# Patient Record
Sex: Male | Born: 1945
Health system: Southern US, Community
[De-identification: ages and names within clinical notes are randomized; demographics above are authoritative.]

## PROBLEM LIST (undated history)

## (undated) DIAGNOSIS — Z87442 Personal history of urinary calculi: Secondary | ICD-10-CM

## (undated) DIAGNOSIS — Z9889 Other specified postprocedural states: Secondary | ICD-10-CM

## (undated) DIAGNOSIS — M1712 Unilateral primary osteoarthritis, left knee: Secondary | ICD-10-CM

## (undated) DIAGNOSIS — H269 Unspecified cataract: Secondary | ICD-10-CM

## (undated) DIAGNOSIS — M1711 Unilateral primary osteoarthritis, right knee: Secondary | ICD-10-CM

## (undated) DIAGNOSIS — K219 Gastro-esophageal reflux disease without esophagitis: Secondary | ICD-10-CM

## (undated) DIAGNOSIS — K222 Esophageal obstruction: Secondary | ICD-10-CM

## (undated) DIAGNOSIS — Z96652 Presence of left artificial knee joint: Secondary | ICD-10-CM

## (undated) DIAGNOSIS — D126 Benign neoplasm of colon, unspecified: Secondary | ICD-10-CM

## (undated) HISTORY — PX: LITHOTRIPSY: SUR834

## (undated) HISTORY — PX: KNEE SURGERY: SHX244

## (undated) HISTORY — DX: Benign neoplasm of colon, unspecified: D12.6

## (undated) HISTORY — PX: BUNIONECTOMY: SHX129

## (undated) HISTORY — DX: Gastro-esophageal reflux disease without esophagitis: K21.9

## (undated) HISTORY — PX: FINGER SURGERY: SHX640

## (undated) HISTORY — DX: Other specified postprocedural states: Z98.890

## (undated) HISTORY — DX: Esophageal obstruction: K22.2

## (undated) HISTORY — PX: UMBILICAL HERNIA REPAIR: SHX196

## (undated) HISTORY — PX: INGUINAL HERNIA REPAIR: SUR1180

---

## 1997-11-06 ENCOUNTER — Ambulatory Visit (HOSPITAL_BASED_OUTPATIENT_CLINIC_OR_DEPARTMENT_OTHER): Admission: RE | Admit: 1997-11-06 | Discharge: 1997-11-06 | Payer: Self-pay | Admitting: Orthopedic Surgery

## 1999-09-02 ENCOUNTER — Ambulatory Visit (HOSPITAL_BASED_OUTPATIENT_CLINIC_OR_DEPARTMENT_OTHER): Admission: RE | Admit: 1999-09-02 | Discharge: 1999-09-02 | Payer: Self-pay | Admitting: Orthopedic Surgery

## 2000-05-26 DIAGNOSIS — D126 Benign neoplasm of colon, unspecified: Secondary | ICD-10-CM

## 2000-05-26 HISTORY — DX: Benign neoplasm of colon, unspecified: D12.6

## 2000-11-24 ENCOUNTER — Encounter: Payer: Self-pay | Admitting: Family Medicine

## 2000-11-24 ENCOUNTER — Ambulatory Visit (HOSPITAL_COMMUNITY): Admission: RE | Admit: 2000-11-24 | Discharge: 2000-11-24 | Payer: Self-pay | Admitting: Family Medicine

## 2001-03-23 ENCOUNTER — Ambulatory Visit (HOSPITAL_COMMUNITY): Admission: RE | Admit: 2001-03-23 | Discharge: 2001-03-23 | Payer: Self-pay | Admitting: General Surgery

## 2001-07-28 ENCOUNTER — Ambulatory Visit (HOSPITAL_COMMUNITY): Admission: RE | Admit: 2001-07-28 | Discharge: 2001-07-28 | Payer: Self-pay | Admitting: Family Medicine

## 2001-07-28 ENCOUNTER — Encounter: Payer: Self-pay | Admitting: Family Medicine

## 2002-03-10 ENCOUNTER — Encounter: Payer: Self-pay | Admitting: Family Medicine

## 2002-03-10 ENCOUNTER — Ambulatory Visit (HOSPITAL_COMMUNITY): Admission: RE | Admit: 2002-03-10 | Discharge: 2002-03-10 | Payer: Self-pay | Admitting: Family Medicine

## 2004-02-26 ENCOUNTER — Ambulatory Visit (HOSPITAL_COMMUNITY): Admission: RE | Admit: 2004-02-26 | Discharge: 2004-02-26 | Payer: Self-pay | Admitting: Internal Medicine

## 2004-12-03 IMAGING — CT CT PELVIS W/O CM
1 of 2 series · 16 of 32 positions shown, 20 images · non-contrast
Comparison: 07/28/2001

HISTORY: Right flank pain, history kidney stones

CT ABDOMEN AND PELVIS WITHOUT CONTRAST
Multidetector helical CT imaging of abdomen and pelvis performed using kidney stone protocol.
Neither oral nor intravenous contrast utilized for this indication.

[Series 7266: — · axial · 0.62mm/px · z∈[+1331,+1711]mm · 16 of 84 slices shown, 20 images]
[im 4/84  soft-tissue]
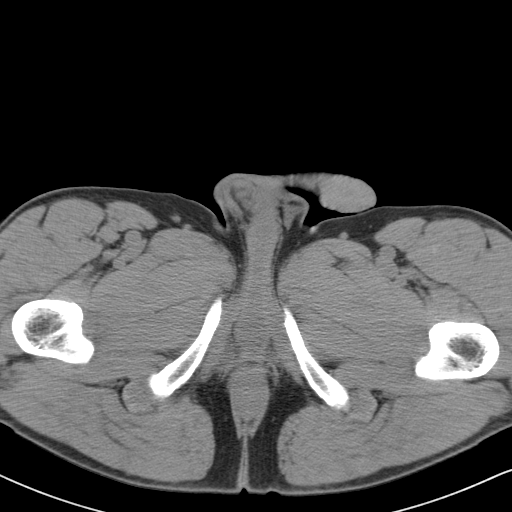
[im 4/84  bone]
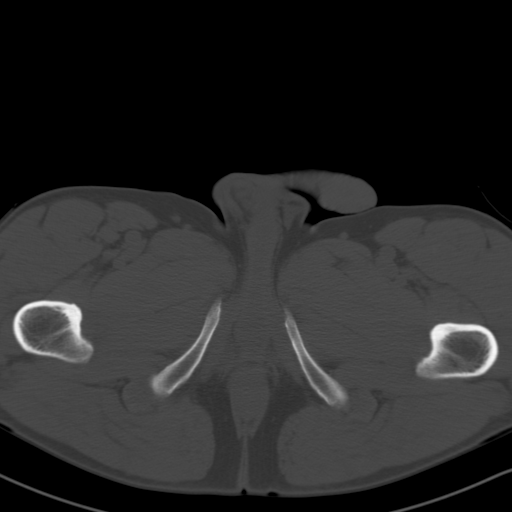
[im 11/84  soft-tissue]
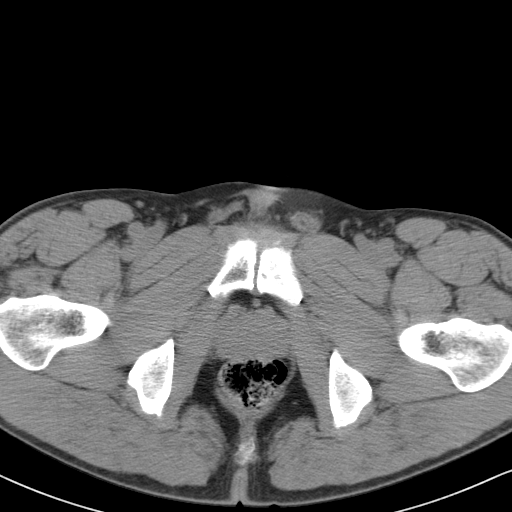
[im 18/84  soft-tissue]
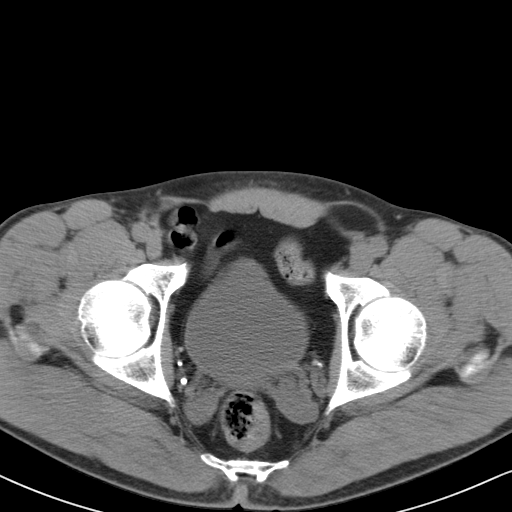
[im 21/84  soft-tissue]
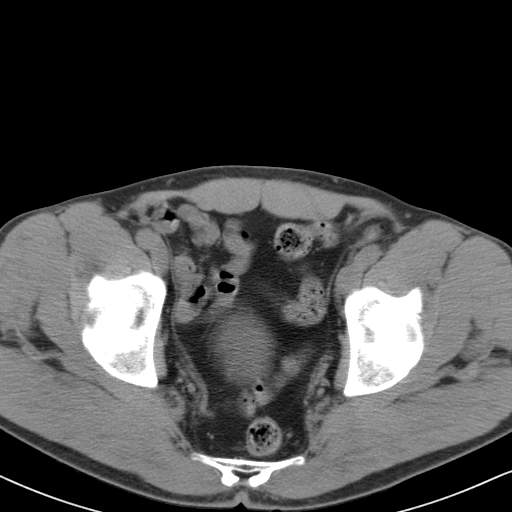
[im 28/84  soft-tissue]
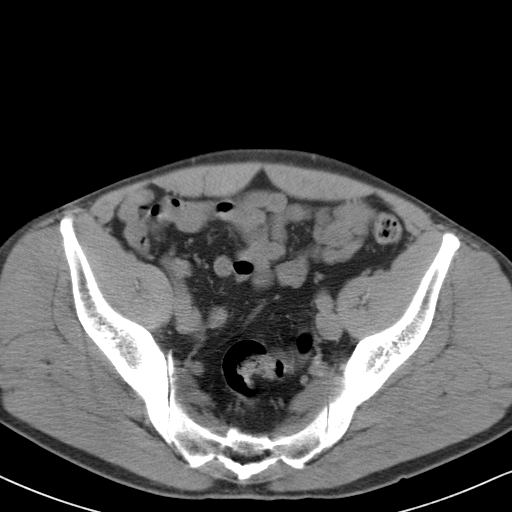
[im 35/84  soft-tissue]
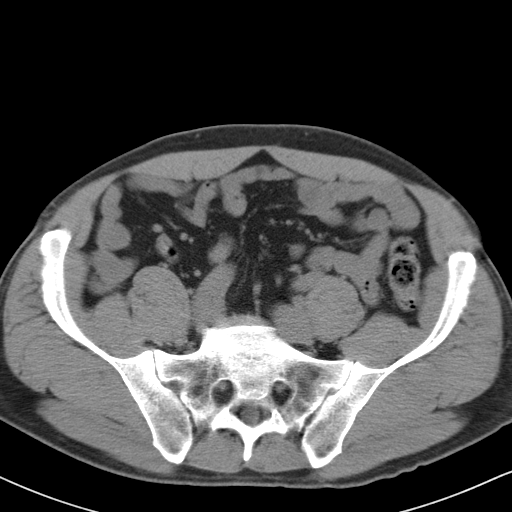
[im 39/84  soft-tissue]
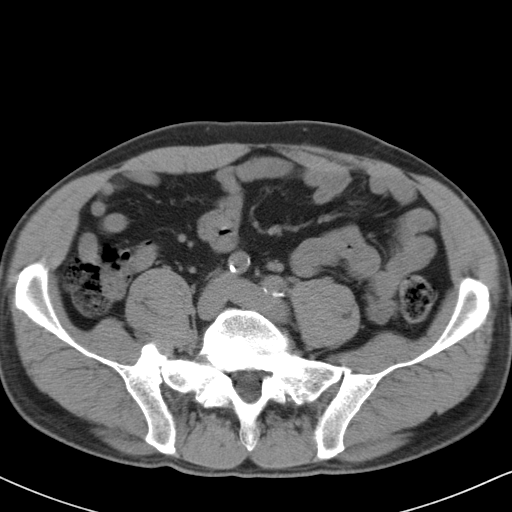
[im 45/84  soft-tissue]
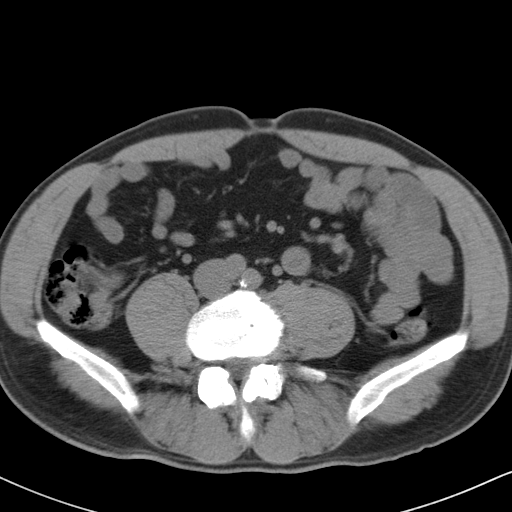
[im 49/84  soft-tissue]
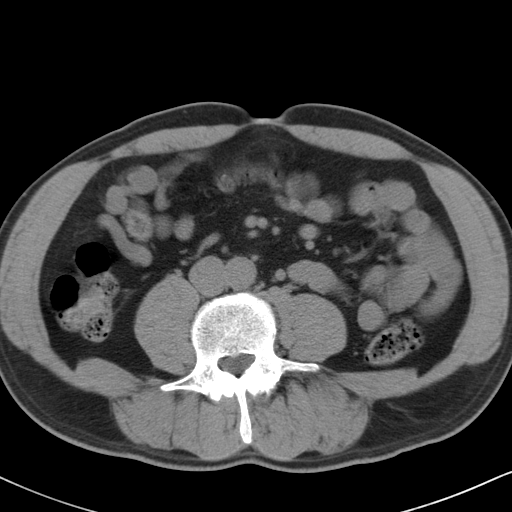
[im 49/84  bone]
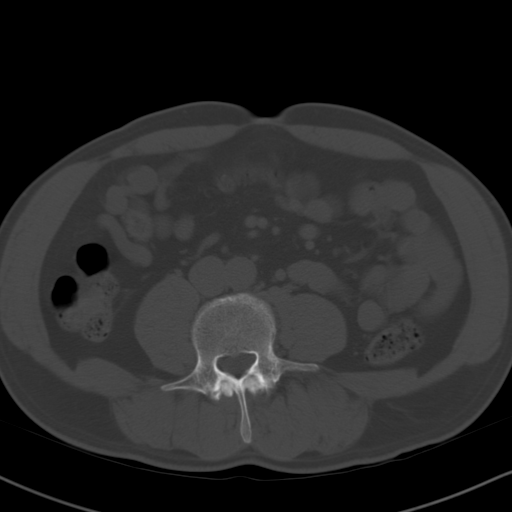
[im 56/84  soft-tissue]
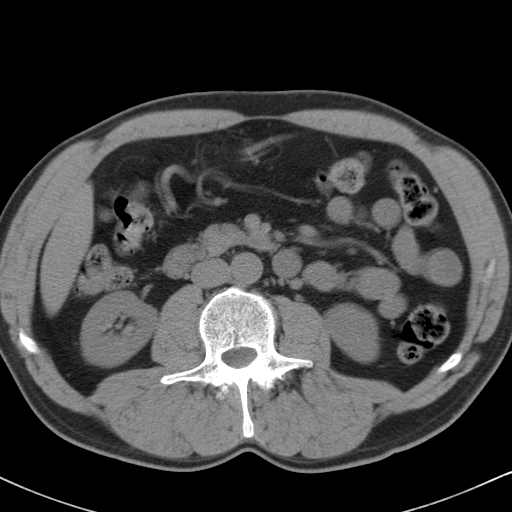
[im 63/84  soft-tissue]
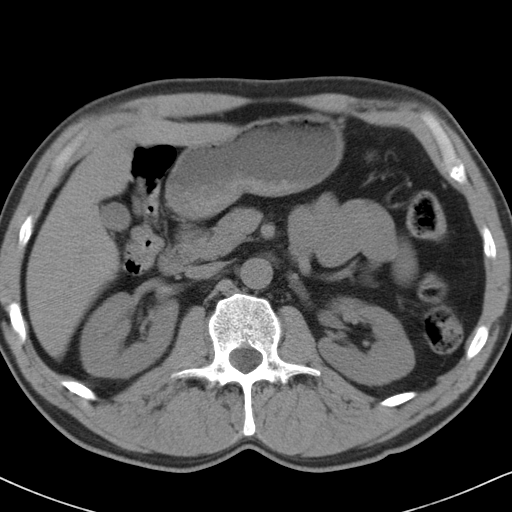
[im 66/84  soft-tissue]
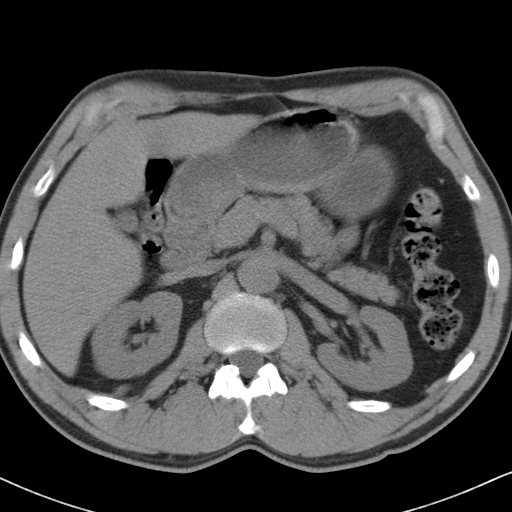
[im 70/84  lung]
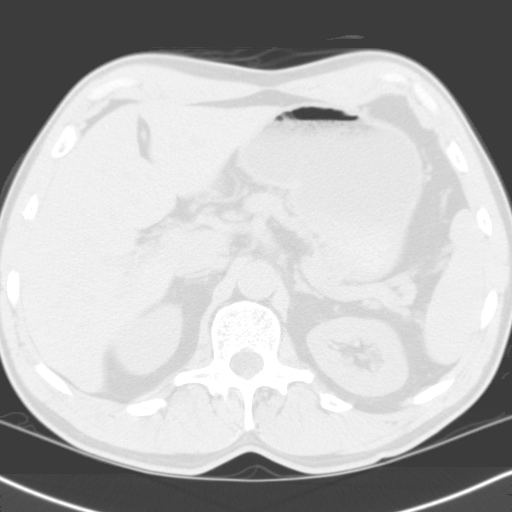
[im 73/84  soft-tissue]
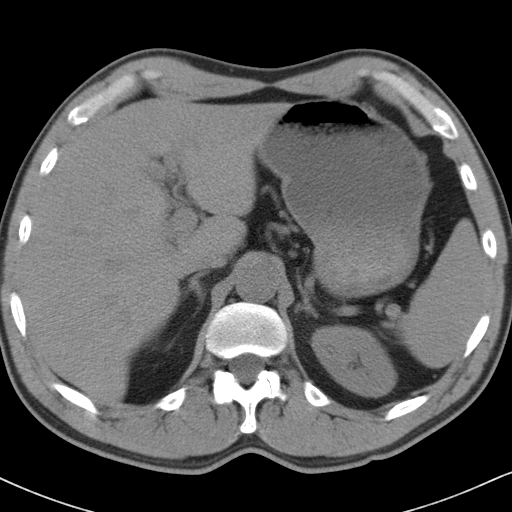
[im 73/84  lung]
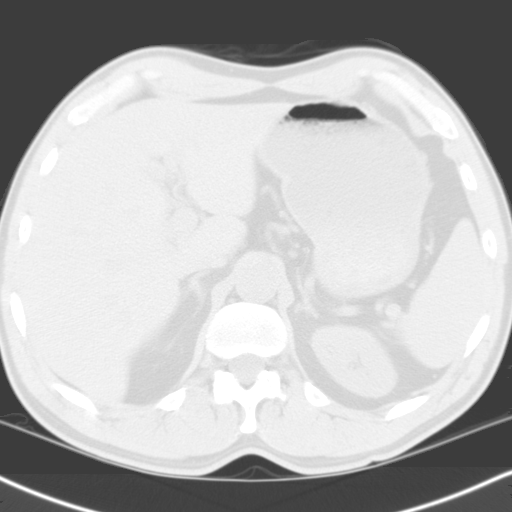
[im 77/84  lung]
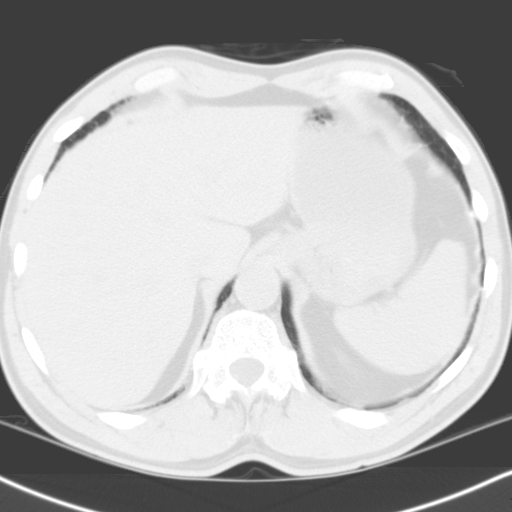
[im 80/84  soft-tissue]
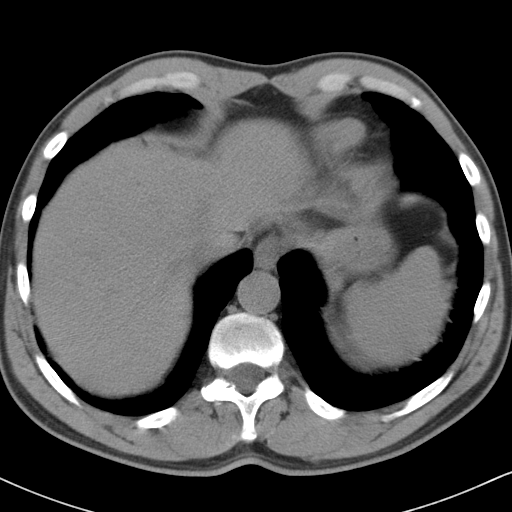
[im 80/84  lung]
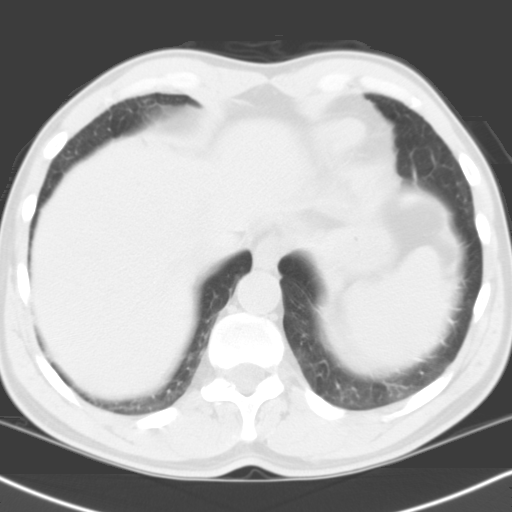

[16 of 32 positions shown; findings below may reference images not displayed]

CT ABDOMEN:

5 mm diameter calculus mid right kidney.
No hydronephrosis or ureteral dilatation.
Remaining solid organs and bowel loops unremarkable for a noncontrast study.
IMPRESSION: 5 mm diameter right renal calculus.
No evidence of ureteral calcification or obstruction.

CT PELVIS:

Normal appendix.
No distal ureteral calcification or dilatation.
Bilateral pelvic phleboliths.
No pelvic mass, adenopathy, or free fluid.
Pelvic bowel loops unremarkable.
No hernia.
IMPRESSION: No pelvic abnormalities.

## 2006-03-05 ENCOUNTER — Ambulatory Visit (HOSPITAL_COMMUNITY): Admission: RE | Admit: 2006-03-05 | Discharge: 2006-03-05 | Payer: Self-pay | Admitting: Family Medicine

## 2006-03-17 ENCOUNTER — Ambulatory Visit: Payer: Self-pay | Admitting: Internal Medicine

## 2006-05-15 ENCOUNTER — Ambulatory Visit: Payer: Self-pay | Admitting: Internal Medicine

## 2006-05-15 ENCOUNTER — Ambulatory Visit (HOSPITAL_COMMUNITY): Admission: RE | Admit: 2006-05-15 | Discharge: 2006-05-15 | Payer: Self-pay | Admitting: Internal Medicine

## 2006-05-15 DIAGNOSIS — K222 Esophageal obstruction: Secondary | ICD-10-CM

## 2006-05-15 DIAGNOSIS — Z9889 Other specified postprocedural states: Secondary | ICD-10-CM

## 2006-05-15 HISTORY — DX: Esophageal obstruction: K22.2

## 2006-05-15 HISTORY — DX: Other specified postprocedural states: Z98.890

## 2010-05-26 HISTORY — PX: ESOPHAGOGASTRODUODENOSCOPY: SHX1529

## 2010-10-11 NOTE — Op Note (Signed)
[place]  Patient:    Dustin Jacobson, Dustin Jacobson                MRN: [id number] Proc. Date: 09/02/99 Attending:  Elana Alm. Thurston Hole, M.D.                           Operative Report  PREOPERATIVE DIAGNOSIS:  Left knee medial meniscus tear.  POSTOPERATIVE DIAGNOSES: 1. Left knee medial and lateral meniscal tears. 2. 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 3. Left knee grade 1 anterior cruciate ligament laxity.  PROCEDURES: 1. Left knee examination under anesthesia, followed by arthroscopic partial medial    and lateral meniscectomies. 2. Left knee medial compartment and patellofemoral chondroplasty.  SURGEON:  Elana Alm. Thurston Hole, M.D.  ASSISTANT:  Kirstin Adelberger, P.A.  ANESTHESIA:  Local with MAC.  OPERATIVE TIME:  30 minutes.  COMPLICATIONS:  None.  INDICATIONS:  Mr. Vitali is a 65 year old gentleman, who has had 8-10 months of increasing left knee pain with signs and symptoms consistent with medial meniscus tear and mild ACL laxity.  Failed conservative care and is now to undergo arthroscopy.  DESCRIPTION OF PROCEDURE:  Mr. Mignano was brought to the operating room on September 02, 1999 after a block had been placed in the holding room.  Left knee was examined under anesthesia.  Range of motion zero to 125 degrees.  He had 1+ Lachman with an end point.  Knee stable to varus, valgus, and posterior stress with normal patella tracking.  Left leg was prepped using sterile Betadine and draped using sterile  technique.  Originally through an inferolateral portal, the arthroscope with a ump attachment was placed and through an inferomedial portal, an arthroscopic probe was placed.  On initial inspection of medial compartment, he was found to have 75% grade 3 chondromalacia medial femoral condyle, which was thoroughly debrided. Medial tibial plateau showed mild grade 1 and 2 changes.  Medial meniscus showed  a complex tear posterior and medial horn of which 50% was resected back to a stable rim.  ACL did appear to be intact.  There was some mild laxity 3-5 mm, but there was a firm end point.  Posterior cruciate was intact and stable.  Lateral compartment showed only mild grade 1 changes.  Lateral meniscus showed a partial tear 25% of the inner surface at the posterolateral corner, which was debrided nd partially resected back to a stable rim.  Patellofemoral joint showed grade 3 chondromalacia over 75% of the femoral groove and the patella, which was thoroughly debrided.  Patella tracked normally.  Moderately thickened synovitis was debrided. The medial and lateral gutters otherwise they were free of pathology.  At this point, it is felt that all pathology had been satisfactorily addressed.  The instruments were removed.  Portals were closed with 3-0 nylon suture and injected with 0.25% Marcaine with epinephrine and 5 mg of morphine.  Sterile dressings applied and the patient awakened and taken to recovery room in stable condition.  FOLLOW-UP CARE:  Mr. Moan will be followed as an outpatient on Vicodin and Naprosyn.  See me back in my office in a week for sutures out and follow-up. DD:  09/02/99 TD:  09/02/99 Job: [id number]

## 2010-10-11 NOTE — Consult Note (Signed)
NAME:  TAINO, MAERTENS NO.:  [id number]   MEDICAL RECORD NO.:  [id number]           PATIENT TYPE:   LOCATION:                                 FACILITY:   PHYSICIAN:  R. Roetta Sessions, M.D. DATE OF BIRTH:  November 15, 1945   DATE OF CONSULTATION:  03/17/2006  DATE OF DISCHARGE:                                   CONSULTATION   REQUESTING PHYSICIAN:  Kirk Ruths, M.D.   CHIEF COMPLAINT:  Constipation, rectal bleeding, trouble swallowing.   HISTORY OF PRESENT ILLNESS:  Mr. Schroader is a 65 year old Caucasian  gentleman who presents for further evaluation of above stated symptoms.  He  has been having difficulty swallowing food 2-3 times a week.  He states he  is actually when he swallowing cold liquids along with solid foods he notes  that he gets hiccups and often regurgitates his food.  He denies any  associated pain, no heartburn, abdominal pain.  He has issues with  constipation but generally has several bowel movements a week and is trying  to control it with a high fiber diet.  He has a history of hemorrhoids and  feels that his rectal bleeding is related to that.  He reports having 3  polyps removed at his last colonoscopy 5 years ago and is due for a followup  colonoscopy.   CURRENT MEDICATIONS:  1. Nexium 40 mg daily, just started 2 weeks ago.  2. Diclofenac 75 mg b.i.d., taken for arthritis.   ALLERGIES:  NO KNOWN DRUG ALLERGIES.   PAST MEDICAL HISTORY:  Arthritis.   PAST SURGICAL HISTORY:  1. He had ACL repair of his right knee.  2. He had a left knee scope.  3. Left middle finger surgery.  4. Tonsillectomy.  5. Umbilical hernia repair.  6. Left inguinal hernia repair.   FAMILY HISTORY:  Mother died at age 48 of CHF.  Father died at age 22 of MI.  No family history of colorectal cancer.   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  Never been a smoker.  No alcohol  use.   REVIEW OF SYSTEMS:  See HPI for GI.  CONSTITUTIONAL:  No weight loss.  CARDIOPULMONARY:  No chest pain or shortness of breath.   PHYSICAL EXAMINATION:  VITAL SIGNS: Weight 184, height 5 feet 10 inches,  temp 98.2, blood pressure 122/60, pulse 74.  GENERAL:  A pleasant, well nourished, well developed Caucasian male in no  acute distress.  SKIN:  Warm and dry.  No jaundice.  HEENT:  Sclerae are nonicteric.  Oropharyngeal mucosa is moist and pink.  No  lesions, erythema or exudate.  No lymphadenopathy, thyromegaly.  CHEST/LUNGS:  Clear to auscultation.  CARDIAC:  Reveals a regular rate and rhythm.  Normal S1, S2.  No murmurs,  rubs, or gallops.  ABDOMEN:  Positive bowel sounds.  Soft, nontender, nondistended.  No  organomegaly or masses.  No rebound tenderness or guarding.  No abdominal  bruits or hernias.  EXTREMITIES:  No  edema.   IMPRESSION:  1. Mr. Springer is a 65 year old gentleman who has a history of chronic      constipation, intermittent hematochezia in the setting of hemorrhoidal      disease.  2. He also reports a history of colonic polyps, last colonoscopy 5 years      ago.  3. He complains of dysphagia.  It is not clearly solid food dysphagia and      he has regurgitation when he develops hiccups with cold liquids.      Symptoms are frequent.  Would recommend upper endoscopy for further      evaluation at time of colonoscopy.   I discussed risks, alternatives, benefits with regards to risk of reaction  to medication, incomplete exam, risk of bleeding, perforation, infection.  The patient is agreeable to proceed.   PLAN:  1. EGD with possible esophageal dilatation, colonoscopy near future.  2. He will continue Nexium 40 mg daily for now.  3. Further recommendations to follow.      Tana Coast, P.AJonathon Bellows, M.D.  Electronically Signed    LL/MEDQ  D:  03/17/2006  T:  03/18/2006  Job:  [id number]   cc:   Kirk Ruths, M.D.  Fax:  [phone]

## 2010-10-11 NOTE — Op Note (Signed)
NAME:  Dustin Jacobson, Dustin Jacobson NO.:  [id number]   MEDICAL RECORD NO.:  [id number]          PATIENT TYPE:  AMB   LOCATION:  DAY                           FACILITY:  [facility]   PHYSICIAN:  R. Roetta Sessions, M.D. DATE OF BIRTH:  06-Dec-1945   DATE OF PROCEDURE:  05/15/2006  DATE OF DISCHARGE:                               OPERATIVE REPORT   INDICATIONS FOR PROCEDURE:  The patient is a 65 year old gentleman with  chronic esophageal dysphagia to solids and constipation and intermittent  hematochezia.  There is no family history colorectal neoplasia.  He  reports having a colonoscopy here by someone else five years ago, polyps  were found.  EGD and colonoscopy are now being done.  The for esophageal  dilation reviewed.  The potential risks, benefits and alternatives have  been discussed, he is agreeable.  Please see documentation in the  medical record.   PROCEDURE NOTE:  O2 saturation, blood pressure, and pulse rate was  monitored throughout the entire procedure.   CONSCIOUS SEDATION:  Versed 5 mg IV and Demerol 75 mg IV in divided  doses.   INSTRUMENTATION:  Pentax video chip system.   FINDINGS:  EGD:  Examination of the tubular esophagus revealed a  prominent Schatzki's ring.  The esophageal mucosa, otherwise, appeared  normal.  The EG junction was easily traversed.  Stomach:  The gastric cavity was empty and insufflated well with air.  A  thorough examination of the gastric mucosa with retroflexion in the  proximal stomach, esophagus, esophagogastric junction demonstrated only  a small hiatal hernia.  The pylorus was patent and easily traversed.  Examination of the bulb and second portion revealed no abnormalities.   THERAPEUTIC/DIAGNOSTIC MANEUVERS PERFORMED:  The scope was withdrawn.  A  54-French Maloney dilator was passed full insertion, a look back  revealed a tear at the level of the ring without apparent other  complication.  The patient tolerated the procedure  well and was prepared  for colonoscopy.   DESCRIPTION OF PROCEDURE:  Digital rectal exam revealed no  abnormalities.  The prep was excellent.   FINDINGS:  Colon:  The colonic mucosa was surveyed from rectosigmoid  junction through the left transverse and right colon to the appendiceal  orifice, ileocecal valve, and cecum.  These structures were well seen  and photographed for the record.  From this level, the scope was slowly  and cautiously withdrawn and all previously mentioned mucosal surfaces  were again seen.  The colonic mucosa appeared normal.  The scope was  pulled down into the rectum where a thorough examination of the rectal  mucosa, fossa, and retroflexion view of the anal verge was undertaken.  The patient had some friable anal canal hemorrhoids, otherwise, the  rectal mucosa appeared normal.  The patient tolerated both procedures  well and was reacted in endoscopy.   IMPRESSION:  EGD, prominent Schatzki's ring, otherwise normal esophagus  status post dilation as described above.  Small hiatal hernia, otherwise  normal stomach, D1, D2.  Colonoscopy findings, friable anal canal hemorrhoids, otherwise, normal  rectum and colon.   RECOMMENDATIONS:  1. Continue Nexium 40 mg orally daily.  2. Antireflux measures/literature provided to Mr. Mimbs.  3. Constipation and hemorrhoid literature provided to Mr. Balfour.  4. MiraLax 17 grams orally at bedtime p.r.n. constipation.  A ten day      course of Anusol HC suppositories, one per rectum at bedtime.  5.      I would recommend Mr. Chiappetta come back in five years for repeat      colonoscopy given his history of colonic polyps previously.      Jonathon Bellows, M.D.  Electronically Signed     RMR/MEDQ  D:  05/15/2006  T:  05/15/2006  Job:  [id number]   cc:   Kirk Ruths, M.D.  Fax: [phone]

## 2011-03-14 ENCOUNTER — Encounter: Payer: Self-pay | Admitting: Internal Medicine

## 2011-03-31 ENCOUNTER — Ambulatory Visit (INDEPENDENT_AMBULATORY_CARE_PROVIDER_SITE_OTHER): Payer: BC Managed Care – PPO | Admitting: Urgent Care

## 2011-03-31 ENCOUNTER — Encounter: Payer: Self-pay | Admitting: Urgent Care

## 2011-03-31 DIAGNOSIS — R1310 Dysphagia, unspecified: Secondary | ICD-10-CM

## 2011-03-31 DIAGNOSIS — D126 Benign neoplasm of colon, unspecified: Secondary | ICD-10-CM | POA: Insufficient documentation

## 2011-03-31 DIAGNOSIS — Q393 Congenital stenosis and stricture of esophagus: Secondary | ICD-10-CM

## 2011-03-31 DIAGNOSIS — K219 Gastro-esophageal reflux disease without esophagitis: Secondary | ICD-10-CM

## 2011-03-31 DIAGNOSIS — K222 Esophageal obstruction: Secondary | ICD-10-CM | POA: Insufficient documentation

## 2011-03-31 MED ORDER — OMEPRAZOLE 20 MG PO CPDR: 20.0000 mg | DELAYED_RELEASE_CAPSULE | Freq: Every day | ORAL | Status: DC

## 2011-03-31 NOTE — Assessment & Plan Note (Signed)
See dysphagia. ?

## 2011-03-31 NOTE — Progress Notes (Signed)
Primary Care Physician:  Cassell Smiles., MD Primary Gastroenterologist:  Dr. Jena Gauss  Chief Complaint  Patient presents with  . Colonoscopy   HPI:  Dustin Jacobson is a 65 y.o. male here to set up colonoscopy. He has history of polyps back in 2002 on colonoscopy done elsewhere. Last colonoscopy was by Dr. Jena Gauss in 2007 & it was normal. He denies any constipation or diarrhea. He denies any rectal bleeding or melena. He does however have chronic GERD.  He notes heartburn & indigestion at least once per week or so. He would like to try a PPI.  History of Schatzki's ring with last dilation 2007. Has been using Apple Cider for his reflux.  C/o solid food dysphagia.  Feels like food stuck in upper esophagus.    Denies any problems with liquids. Denies any odynophagia. His weight remains stable.  Past Medical History  Diagnosis Date  . Adenomatous colon polyp 2002  . Schatzki's ring 05/15/2006    Last EGD Dr Rourk-> dilated 55F, small hiatal hernia  . S/P colonoscopy 05/15/2006    Dr Audelia Hives anal canal hemorrhoids, otherwise normal  . GERD (gastroesophageal reflux disease)     Past Surgical History  Procedure Date  . Knee surgery     bilat  . Finger surgery   . Bunionectomy   . Inguinal hernia repair     bilat  . Umbilical hernia repair     Current Outpatient Prescriptions  Medication Sig Dispense Refill  . fish oil-omega-3 fatty acids 1000 MG capsule Take 2 g by mouth daily.        . Flaxseed, Linseed, (FLAX SEEDS PO) Take by mouth.        . Multiple Vitamin (MULTIVITAMIN) capsule Take 1 capsule by mouth daily.        . Red Yeast Rice 600 MG CAPS Take 600 mg by mouth daily.        Marland Kitchen omeprazole (PRILOSEC) 20 MG capsule Take 1 capsule (20 mg total) by mouth daily.  31 capsule  11    Allergies as of 03/31/2011  . (No Known Allergies)    Family History:There is no known family history of colorectal carcinoma , liver disease, or inflammatory bowel disease.  Problem Relation Age  of Onset  . Heart failure Mother   . Coronary artery disease Father   . Lung cancer Brother     History   Social History  . Marital Status: Divorced    Spouse Name: N/A    Number of Children: 2  . Years of Education: N/A   Occupational History  . retired; Runner, broadcasting/film/video    Social History Main Topics  . Smoking status: Never Smoker   . Smokeless tobacco: Not on file   Comment: quit about 40 yrs ago  . Alcohol Use: No  . Drug Use: No  . Sexually Active: Not on file   Other Topics Concern  . Not on file   Social History Narrative   Lives w/ wife    Review of Systems: Gen: Denies any fever, chills, sweats, anorexia, fatigue, weakness, malaise, weight loss, and sleep disorder CV: Denies chest pain, angina, palpitations, syncope, orthopnea, PND, peripheral edema, and claudication. Resp: Denies dyspnea at rest, dyspnea with exercise, cough, sputum, wheezing, coughing up blood, and pleurisy. GI: Denies vomiting blood, jaundice, and fecal incontinence.  GU : Denies urinary burning, blood in urine, urinary frequency, urinary hesitancy, nocturnal urination, and urinary incontinence. MS: Denies joint pain, limitation of movement, and swelling, stiffness, low  back pain, extremity pain. Denies muscle weakness, cramps, atrophy.  Derm: Denies rash, itching, dry skin, hives, moles, warts, or unhealing ulcers.  Psych: Denies depression, anxiety, memory loss, suicidal ideation, hallucinations, paranoia, and confusion. Heme: Denies bruising, bleeding, and enlarged lymph nodes.  Physical Exam: BP 121/73  Pulse 75  Temp(Src) 98.6 F (37 C) (Temporal)  Ht 5\' 10"  (1.778 m)  Wt 177 lb 6.4 oz (80.468 kg)  BMI 25.45 kg/m2 General:   Alert,  Well-developed, well-nourished, pleasant and cooperative in NAD Head:  Normocephalic and atraumatic. Eyes:  Sclera clear, no icterus.   Conjunctiva pink. Ears:  Normal auditory acuity. Nose:  No deformity, discharge,  or lesions. Mouth:  No deformity or  lesions, oropharynx pink and moist. Neck:  Supple; no masses or thyromegaly. Lungs:  Clear throughout to auscultation.   No wheezes, crackles, or rhonchi. No acute distress. Heart:  Regular rate and rhythm; no murmurs, clicks, rubs,  or gallops. Abdomen:  Soft, nontender and nondistended. No masses, hepatosplenomegaly or hernias noted. Normal bowel sounds, without guarding, and without rebound.   Rectal:  Deferred until time of colonoscopy.   Msk:  Symmetrical without gross deformities. Normal posture. Pulses:  Normal pulses noted. Extremities:  Without clubbing or edema. Neurologic:  Alert and  oriented x4;  grossly normal neurologically. Skin:  Intact without significant lesions or rashes. Cervical Nodes:  No significant cervical adenopathy. Psych:  Alert and cooperative. Normal mood and affect.  '

## 2011-03-31 NOTE — Assessment & Plan Note (Signed)
Chronic complicated GERD with history of Schatzki's ring. He has recurrent dysphasia and symptomatic GERD. He admits to being noncompliant with PPI.  Resume omeprazole 20 mg daily, #31, 11 refills. EGD/ED

## 2011-03-31 NOTE — Assessment & Plan Note (Signed)
Suspect recurrent Schatzki's ring. EGD with possible esophageal dilation with Dr. Jena Gauss in the near future.  I have discussed risks & benefits which include, but are not limited to, bleeding, infection, perforation & drug reaction.  The patient agrees with this plan & written consent will be obtained.

## 2011-03-31 NOTE — Progress Notes (Signed)
Cc to PCP 

## 2011-03-31 NOTE — Assessment & Plan Note (Signed)
History of colon polyps 2002. Last colonoscopy normal 2007. Due for surveillance colonoscopy.  I have discussed risks & benefits which include, but are not limited to, bleeding, infection, perforation & drug reaction.  The patient agrees with this plan & written consent will be obtained.

## 2011-03-31 NOTE — Patient Instructions (Signed)
Omepazole 20mg  daily Keep colonoscopy & EGD with possible dilation as planned.

## 2011-04-07 ENCOUNTER — Ambulatory Visit: Payer: Self-pay | Admitting: Urgent Care

## 2011-04-21 ENCOUNTER — Encounter (HOSPITAL_COMMUNITY): Payer: Self-pay | Admitting: Pharmacy Technician

## 2011-04-21 ENCOUNTER — Other Ambulatory Visit: Payer: Self-pay | Admitting: Gastroenterology

## 2011-04-21 DIAGNOSIS — Z860100 Personal history of colon polyps, unspecified: Secondary | ICD-10-CM

## 2011-04-21 DIAGNOSIS — K219 Gastro-esophageal reflux disease without esophagitis: Secondary | ICD-10-CM

## 2011-04-21 MED ORDER — PEG-KCL-NACL-NASULF-NA ASC-C 100 G PO SOLR: 1.0000 | Freq: Once | ORAL | Status: DC

## 2011-04-25 MED ORDER — SODIUM CHLORIDE 0.45 % IV SOLN
Freq: Once | INTRAVENOUS | Status: AC
  Administered 2011-04-28: 10:00:00 via INTRAVENOUS

## 2011-04-28 ENCOUNTER — Ambulatory Visit (HOSPITAL_COMMUNITY)
Admission: RE | Admit: 2011-04-28 | Discharge: 2011-04-28 | Disposition: A | Discharge status: Home / Self Care | Payer: BC Managed Care – PPO | Source: Ambulatory Visit | Attending: Internal Medicine | Admitting: Internal Medicine

## 2011-04-28 ENCOUNTER — Other Ambulatory Visit: Payer: Self-pay | Admitting: Internal Medicine

## 2011-04-28 ENCOUNTER — Encounter (HOSPITAL_COMMUNITY): Payer: Self-pay

## 2011-04-28 ENCOUNTER — Encounter (HOSPITAL_COMMUNITY)
Admission: RE | Disposition: A | Discharge status: Home / Self Care | Payer: Self-pay | Source: Ambulatory Visit | Attending: Internal Medicine

## 2011-04-28 DIAGNOSIS — Z860100 Personal history of colon polyps, unspecified: Secondary | ICD-10-CM | POA: Insufficient documentation

## 2011-04-28 DIAGNOSIS — K222 Esophageal obstruction: Secondary | ICD-10-CM

## 2011-04-28 DIAGNOSIS — R1310 Dysphagia, unspecified: Secondary | ICD-10-CM | POA: Insufficient documentation

## 2011-04-28 DIAGNOSIS — K2940 Chronic atrophic gastritis without bleeding: Secondary | ICD-10-CM | POA: Insufficient documentation

## 2011-04-28 DIAGNOSIS — K5730 Diverticulosis of large intestine without perforation or abscess without bleeding: Secondary | ICD-10-CM

## 2011-04-28 DIAGNOSIS — K449 Diaphragmatic hernia without obstruction or gangrene: Secondary | ICD-10-CM | POA: Insufficient documentation

## 2011-04-28 DIAGNOSIS — D126 Benign neoplasm of colon, unspecified: Secondary | ICD-10-CM | POA: Insufficient documentation

## 2011-04-28 DIAGNOSIS — K2100 Gastro-esophageal reflux disease with esophagitis, without bleeding: Secondary | ICD-10-CM | POA: Insufficient documentation

## 2011-04-28 DIAGNOSIS — Z1211 Encounter for screening for malignant neoplasm of colon: Secondary | ICD-10-CM

## 2011-04-28 DIAGNOSIS — A048 Other specified bacterial intestinal infections: Secondary | ICD-10-CM | POA: Insufficient documentation

## 2011-04-28 DIAGNOSIS — K219 Gastro-esophageal reflux disease without esophagitis: Secondary | ICD-10-CM

## 2011-04-28 HISTORY — PX: COLONOSCOPY: SHX5424

## 2011-04-28 SURGERY — COLONOSCOPY
Anesthesia: Moderate Sedation

## 2011-04-28 MED ORDER — BUTAMBEN-TETRACAINE-BENZOCAINE 2-2-14 % EX AERO
INHALATION_SPRAY | CUTANEOUS | Status: DC | PRN
  Administered 2011-04-28: 2 via TOPICAL

## 2011-04-28 MED ORDER — MEPERIDINE HCL 100 MG/ML IJ SOLN
INTRAMUSCULAR | Status: DC | PRN
  Administered 2011-04-28 (×2): 25 mg via INTRAVENOUS
  Administered 2011-04-28: 50 mg via INTRAVENOUS

## 2011-04-28 MED ORDER — STERILE WATER FOR IRRIGATION IR SOLN
Status: DC | PRN
  Administered 2011-04-28: 10:00:00

## 2011-04-28 MED ORDER — MEPERIDINE HCL 100 MG/ML IJ SOLN
INTRAMUSCULAR | Status: AC
  Filled 2011-04-28: qty 2

## 2011-04-28 MED ORDER — MIDAZOLAM HCL 5 MG/5ML IJ SOLN
INTRAMUSCULAR | Status: DC | PRN
  Administered 2011-04-28 (×2): 1 mg via INTRAVENOUS
  Administered 2011-04-28 (×2): 2 mg via INTRAVENOUS

## 2011-04-28 MED ORDER — MIDAZOLAM HCL 5 MG/5ML IJ SOLN
INTRAMUSCULAR | Status: AC
  Filled 2011-04-28: qty 10

## 2011-04-28 NOTE — H&P (Signed)
  I have seen & examined the patient prior to the procedure(s) today and reviewed the history and physical/consultation.  There have been no changes.  After consideration of the risks, benefits, alternatives and imponderables, the patient has consented to the procedure(s).   

## 2011-05-07 ENCOUNTER — Encounter (HOSPITAL_COMMUNITY): Payer: Self-pay | Admitting: Internal Medicine

## 2011-05-12 NOTE — Progress Notes (Signed)
Quick Note:  Pt aware, rx called [facility]. ______

## 2011-05-12 NOTE — Progress Notes (Signed)
Quick Note:  Tried to call pt- LMOM ______ 

## 2014-05-17 ENCOUNTER — Encounter: Payer: Self-pay | Admitting: Podiatry

## 2014-05-17 ENCOUNTER — Ambulatory Visit (INDEPENDENT_AMBULATORY_CARE_PROVIDER_SITE_OTHER): Payer: Medicare PPO

## 2014-05-17 ENCOUNTER — Ambulatory Visit (INDEPENDENT_AMBULATORY_CARE_PROVIDER_SITE_OTHER): Payer: Medicare PPO | Admitting: Podiatry

## 2014-05-17 VITALS — BP 135/86 | HR 70 | Resp 13 | Ht 70.0 in | Wt 175.0 lb

## 2014-05-17 DIAGNOSIS — M2041 Other hammer toe(s) (acquired), right foot: Secondary | ICD-10-CM

## 2014-05-17 DIAGNOSIS — Z472 Encounter for removal of internal fixation device: Secondary | ICD-10-CM

## 2014-05-17 MED ORDER — CEPHALEXIN 500 MG PO CAPS: 500.0000 mg | ORAL_CAPSULE | Freq: Three times a day (TID) | ORAL | Status: DC

## 2014-05-17 NOTE — Progress Notes (Signed)
   Subjective:    Patient ID: Dustin Jacobson, male    DOB: November 30, 1945, 68 y.o.   MRN: [id number]  HPI Comments: Pt states has multiple episodes of pain and infection with the right 2nd toenail after right bunionectomy and 2nd hammer toe repair surgery with Dr. Janus Molder 4 or 5 years.  Pt states this episode has been going on for 3 weeks.     Review of Systems  All other systems reviewed and are negative.      Objective:   Physical Exam        Assessment & Plan:

## 2014-05-17 NOTE — Progress Notes (Signed)
Subjective:     Patient ID: Dustin Jacobson, male   DOB: [birth year], 68 y.o.   MRN: [id number]  HPI patient states he's had a lot of problems with the distal medial aspect of the second toe right where he had previous hammertoe done with ingrown toenail deformity and irritated skin formation. Surgery was done approximate 6 years ago by another physician in this group   Review of Systems  All other systems reviewed and are negative.      Objective:   Physical Exam  Constitutional: He is oriented to person, place, and time.  Cardiovascular: Intact distal pulses.   Musculoskeletal: Normal range of motion.  Neurological: He is oriented to person, place, and time.  Skin: Skin is warm.  Nursing note and vitals reviewed.  neurovascular status found to be intact with muscle strength adequate and range of motion of the subtalar midtarsal joint within normal limits good healing structural site from bunion correction right hammertoe repair second right with incurvated nail bed medial border and distal keratotic lesion formation     Assessment:     Probable ingrown toenail deformity right with distal keratotic tissue formation    Plan:     H&P and condition discussed. At this point I infiltrated the right second toe 60 mg Xylocaine Marcaine mixture and remove the medial border of the nailbed when doing this I noted that there was a screw that was present secondary to a previous digital fusion done many years ago which may be causing him irritation. I recommended removal of the screw and started with a screwdriver which unfortunately was not successful due to a stripping of the screw head. I then worked with Dr. Jacqualyn Posey and we utilize numerous different devices were able to get it out by approximately 1 cm but no further due to coachman from the previous surgery and the years that was inside the bone. We cut it against the bone and it was satisfactory at this position flushed the area copiously with  sterile saline and applied sterile dressing. As a precautionary measure I did place on cephalexin 500 mg 3 times a day and sterile dressing will be maintained for the next week and he will be checked back. I do think this will solve his problem with removal of the fixation partial screw. Again encouraged if any issues should occur redness or any other issues associated with the procedure we performed today

## 2014-05-17 NOTE — Patient Instructions (Signed)

## 2014-05-24 ENCOUNTER — Encounter: Payer: Self-pay | Admitting: Podiatry

## 2014-05-24 ENCOUNTER — Ambulatory Visit (INDEPENDENT_AMBULATORY_CARE_PROVIDER_SITE_OTHER): Payer: Medicare PPO | Admitting: Podiatry

## 2014-05-24 ENCOUNTER — Ambulatory Visit (INDEPENDENT_AMBULATORY_CARE_PROVIDER_SITE_OTHER): Payer: Medicare PPO

## 2014-05-24 VITALS — BP 128/74 | HR 65 | Resp 16

## 2014-05-24 DIAGNOSIS — Z472 Encounter for removal of internal fixation device: Secondary | ICD-10-CM

## 2014-05-24 DIAGNOSIS — M79674 Pain in right toe(s): Secondary | ICD-10-CM

## 2014-05-24 NOTE — Progress Notes (Signed)
Subjective:     Patient ID: Dustin Jacobson, male   DOB: [birth year], 68 y.o.   MRN: [id number]  HPI patient states my toe is feeling quite a bit better and it is healing well and I am having slight yellowness on my left big toenail I wanted you to look at   Review of Systems     Objective:   Physical Exam Neurovascular status intact muscle strength adequate with crusted area right second toe removed a partial screw from the second digit. Slight yellowness of the hallux medial side    Assessment:     Damaged screw that was able to take the head out and is now feeling much better right second toe with area crusted and no proximal edema erythema drainage and mycotic nail infection left    Plan:     We applied Band-Aid and reviewed x-ray and discussed that things look good at this time but we will have to evaluate in the future. No treatment currently for left hallux as it is growing out

## 2014-07-05 ENCOUNTER — Encounter (HOSPITAL_COMMUNITY): Payer: Self-pay | Admitting: Emergency Medicine

## 2014-07-05 ENCOUNTER — Emergency Department (HOSPITAL_COMMUNITY)
Admission: EM | Admit: 2014-07-05 | Discharge: 2014-07-06 | Disposition: A | Discharge status: Home / Self Care | Payer: Medicare PPO | Attending: Emergency Medicine | Admitting: Emergency Medicine

## 2014-07-05 DIAGNOSIS — T7840XA Allergy, unspecified, initial encounter: Secondary | ICD-10-CM | POA: Diagnosis not present

## 2014-07-05 DIAGNOSIS — Z86010 Personal history of colonic polyps: Secondary | ICD-10-CM | POA: Insufficient documentation

## 2014-07-05 DIAGNOSIS — Z9889 Other specified postprocedural states: Secondary | ICD-10-CM | POA: Diagnosis not present

## 2014-07-05 DIAGNOSIS — Z79899 Other long term (current) drug therapy: Secondary | ICD-10-CM | POA: Insufficient documentation

## 2014-07-05 DIAGNOSIS — K219 Gastro-esophageal reflux disease without esophagitis: Secondary | ICD-10-CM | POA: Diagnosis not present

## 2014-07-05 DIAGNOSIS — Z87738 Personal history of other specified (corrected) congenital malformations of digestive system: Secondary | ICD-10-CM | POA: Insufficient documentation

## 2014-07-05 DIAGNOSIS — T39395A Adverse effect of other nonsteroidal anti-inflammatory drugs [NSAID], initial encounter: Secondary | ICD-10-CM | POA: Insufficient documentation

## 2014-07-05 DIAGNOSIS — Z792 Long term (current) use of antibiotics: Secondary | ICD-10-CM | POA: Diagnosis not present

## 2014-07-05 LAB — CBC WITH DIFFERENTIAL/PLATELET
Basophils Absolute: 0 10*3/uL (ref 0.0–0.1)
Basophils Relative: 0 % (ref 0–1)
EOS ABS: 0.2 10*3/uL (ref 0.0–0.7)
EOS PCT: 1 % (ref 0–5)
HEMATOCRIT: 36.7 % — AB (ref 39.0–52.0)
Hemoglobin: 12.2 g/dL — ABNORMAL LOW (ref 13.0–17.0)
LYMPHS ABS: 2.3 10*3/uL (ref 0.7–4.0)
Lymphocytes Relative: 19 % (ref 12–46)
MCH: 30.3 pg (ref 26.0–34.0)
MCHC: 33.2 g/dL (ref 30.0–36.0)
MCV: 91.1 fL (ref 78.0–100.0)
MONO ABS: 1.2 10*3/uL — AB (ref 0.1–1.0)
MONOS PCT: 10 % (ref 3–12)
Neutro Abs: 8.5 10*3/uL — ABNORMAL HIGH (ref 1.7–7.7)
Neutrophils Relative %: 70 % (ref 43–77)
Platelets: 167 10*3/uL (ref 150–400)
RBC: 4.03 MIL/uL — AB (ref 4.22–5.81)
RDW: 14.1 % (ref 11.5–15.5)
WBC: 12.3 10*3/uL — AB (ref 4.0–10.5)

## 2014-07-05 LAB — BASIC METABOLIC PANEL
ANION GAP: 3 — AB (ref 5–15)
BUN: 29 mg/dL — ABNORMAL HIGH (ref 6–23)
CALCIUM: 7.8 mg/dL — AB (ref 8.4–10.5)
CHLORIDE: 111 mmol/L (ref 96–112)
CO2: 25 mmol/L (ref 19–32)
CREATININE: 1.05 mg/dL (ref 0.50–1.35)
GFR calc non Af Amer: 71 mL/min — ABNORMAL LOW (ref >90)
GFR, EST AFRICAN AMERICAN: 83 mL/min — AB (ref >90)
Glucose, Bld: 147 mg/dL — ABNORMAL HIGH (ref 70–99)
Potassium: 3.7 mmol/L (ref 3.5–5.1)
SODIUM: 139 mmol/L (ref 135–145)

## 2014-07-05 MED ORDER — METHYLPREDNISOLONE SODIUM SUCC 125 MG IJ SOLR
125.0000 mg | Freq: Once | INTRAMUSCULAR | Status: AC
  Administered 2014-07-05: 125 mg via INTRAVENOUS
  Filled 2014-07-05: qty 2

## 2014-07-05 MED ORDER — DIPHENHYDRAMINE HCL 50 MG/ML IJ SOLN
25.0000 mg | Freq: Once | INTRAMUSCULAR | Status: AC
  Administered 2014-07-05: 25 mg via INTRAVENOUS
  Filled 2014-07-05: qty 1

## 2014-07-05 NOTE — ED Provider Notes (Signed)
CSN: [id number]     Arrival date & time 07/05/14  2317 History  This chart was scribed for Veryl Speak, MD by Molli Posey, ED Scribe. This patient was seen in room APA15/APA15 and the patient's care was started 11:22 PM.    Chief Complaint  Patient presents with  . Allergic Reaction   The history is provided by the patient. No language interpreter was used.   HPI Comments: Dustin Jacobson is a 69 y.o. male who presents to the Emergency Department complaining of an allergic reaction that occurred PTA. Pt says he was hammering nails today and it irritated the arthritis in his wrist so he took his wife's Relafen medication to help with his irritation. He says that the palms of his hands, feet and ears started itching and began to get red about 5 minutes afterwards. He states after 10 to 15 minutes he started to get dizzy and then vomited. He says that his wife told him he passed out. Pt states he took 2 benadryl PTA but he vomited shortly after. He says he is feeling better at this time but he is feeling cold and shivering. Pt reports no pertinent past medical history. He denies any throat swelling.   Past Medical History  Diagnosis Date  . Adenomatous colon polyp 2002  . Schatzki's ring 05/15/2006    Last EGD Dr Rourk-> dilated 30F, small hiatal hernia  . S/P colonoscopy 05/15/2006    Dr Laurine Blazer anal canal hemorrhoids, otherwise normal  . GERD (gastroesophageal reflux disease)    Past Surgical History  Procedure Laterality Date  . Knee surgery      bilat  . Finger surgery    . Bunionectomy    . Inguinal hernia repair      bilat  . Umbilical hernia repair    . Colonoscopy  04/28/2011    Procedure: COLONOSCOPY;  Surgeon: Daneil Dolin, MD;  Location: [facility];  Service: Endoscopy;  Laterality: N/A;  10:15   Family History  Problem Relation Age of Onset  . Heart failure Mother   . Coronary artery disease Father   . Lung cancer Brother   . Anesthesia problems Neg Hx    . Hypotension Neg Hx   . Malignant hyperthermia Neg Hx   . Pseudochol deficiency Neg Hx    History  Substance Use Topics  . Smoking status: Never Smoker   . Smokeless tobacco: Not on file     Comment: quit about 40 yrs ago  . Alcohol Use: No     Comment: occassionally    Review of Systems  Constitutional: Positive for chills.  HENT: Negative for trouble swallowing.   Gastrointestinal: Positive for nausea and vomiting.  Neurological: Positive for dizziness.  All other systems reviewed and are negative.   Allergies  Relafen  Home Medications   Prior to Admission medications   Medication Sig Start Date End Date Taking? Authorizing Provider  cephALEXin (KEFLEX) 500 MG capsule Take 1 capsule (500 mg total) by mouth 3 (three) times daily. 05/17/14   Wallene Huh, DPM  fish oil-omega-3 fatty acids 1000 MG capsule Take 1 g by mouth daily.     Historical Provider, MD  Flaxseed, Linseed, (FLAX SEEDS PO) Take by mouth.     Historical Provider, MD  ibuprofen (ADVIL,MOTRIN) 200 MG tablet Take 800 mg by mouth every 6 (six) hours as needed. For pain     Historical Provider, MD  MELOXICAM PO Take by mouth.    Historical  Provider, MD  Multiple Vitamin (MULTIVITAMIN) capsule Take 1 capsule by mouth daily.      Historical Provider, MD  omeprazole (PRILOSEC) 20 MG capsule Take 1 capsule (20 mg total) by mouth daily. 03/31/11 03/30/12  Andria Meuse, NP  omeprazole (PRILOSEC) 20 MG capsule Take 20 mg by mouth daily.    Historical Provider, MD  Red Yeast Rice 600 MG CAPS Take 600 mg by mouth daily.     Historical Provider, MD   BP 111/59 mmHg  Pulse 67  Resp 18  Ht 5\' 10"  (1.778 m)  Wt 175 lb (79.379 kg)  BMI 25.11 kg/m2  SpO2 100% Physical Exam  Constitutional: He is oriented to person, place, and time. He appears well-developed and well-nourished.  HENT:  Head: Normocephalic and atraumatic.  Eyes: Right eye exhibits no discharge. Left eye exhibits no discharge.  Neck: Neck supple.  No tracheal deviation present.  Cardiovascular: Normal rate.   Pulmonary/Chest: Effort normal. No respiratory distress.  Abdominal: He exhibits no distension.  Neurological: He is alert and oriented to person, place, and time.  Skin: Skin is warm and dry.  Psychiatric: He has a normal mood and affect. His behavior is normal.  Nursing note and vitals reviewed.   ED Course  Procedures   DIAGNOSTIC STUDIES: Oxygen Saturation is 100% on RA, normal by my interpretation.    COORDINATION OF CARE: 11:31 PM Discussed treatment plan with pt at bedside and pt agreed to plan.   Labs Review Labs Reviewed - No data to display  Imaging Review No results found.   EKG Interpretation None      MDM   Final diagnoses:  None    Patient presents with complaints of itching and lightheadedness after taking a dose of Relafen. He has never taken this medication before and it is one that belonged to his wife. He was given IV fluids by EMS and received steroids and Benadryl in the ER. Due to his lightheadedness and EKG and laboratory studies were obtained which were unremarkable. He was observed for approximately 2 hours, during which time his symptoms resolved and he is now feeling fine. He will be discharged to home with when necessary return.   I personally performed the services described in this documentation, which was scribed in my presence. The recorded information has been reviewed and is accurate.      Veryl Speak, MD 07/06/14 [phone]

## 2014-07-05 NOTE — ED Notes (Signed)
Patient reports took one of wife's prescription Relafen tablets due to experiencing wrist pain. States he began itching all over then developed hives. Patient reports also started having dizziness and vomiting. EMS reports blood pressure was 90/60 on arrival and patient had hives on chest.

## 2014-07-05 NOTE — ED Notes (Signed)
Patient received zofran 4 mg and 500 ml bolus PTA via EMS. Wife gave patient 50 mg of Benadryl PO.

## 2014-07-06 NOTE — Discharge Instructions (Signed)

## 2014-11-21 DIAGNOSIS — M1712 Unilateral primary osteoarthritis, left knee: Secondary | ICD-10-CM | POA: Diagnosis not present

## 2015-02-26 DIAGNOSIS — Z85828 Personal history of other malignant neoplasm of skin: Secondary | ICD-10-CM | POA: Diagnosis not present

## 2015-02-26 DIAGNOSIS — L570 Actinic keratosis: Secondary | ICD-10-CM | POA: Diagnosis not present

## 2015-02-26 DIAGNOSIS — D485 Neoplasm of uncertain behavior of skin: Secondary | ICD-10-CM | POA: Diagnosis not present

## 2015-03-06 DIAGNOSIS — M1712 Unilateral primary osteoarthritis, left knee: Secondary | ICD-10-CM | POA: Diagnosis not present

## 2015-05-22 DIAGNOSIS — Z6825 Body mass index (BMI) 25.0-25.9, adult: Secondary | ICD-10-CM | POA: Diagnosis not present

## 2015-05-22 DIAGNOSIS — E663 Overweight: Secondary | ICD-10-CM | POA: Diagnosis not present

## 2015-05-22 DIAGNOSIS — J0100 Acute maxillary sinusitis, unspecified: Secondary | ICD-10-CM | POA: Diagnosis not present

## 2015-05-22 DIAGNOSIS — J209 Acute bronchitis, unspecified: Secondary | ICD-10-CM | POA: Diagnosis not present

## 2015-05-22 DIAGNOSIS — Z1389 Encounter for screening for other disorder: Secondary | ICD-10-CM | POA: Diagnosis not present

## 2015-05-22 DIAGNOSIS — N400 Enlarged prostate without lower urinary tract symptoms: Secondary | ICD-10-CM | POA: Diagnosis not present

## 2015-06-11 ENCOUNTER — Ambulatory Visit (INDEPENDENT_AMBULATORY_CARE_PROVIDER_SITE_OTHER): Payer: Medicare Other

## 2015-06-11 ENCOUNTER — Encounter: Payer: Self-pay | Admitting: Podiatry

## 2015-06-11 ENCOUNTER — Ambulatory Visit (INDEPENDENT_AMBULATORY_CARE_PROVIDER_SITE_OTHER): Payer: Medicare Other | Admitting: Podiatry

## 2015-06-11 DIAGNOSIS — M779 Enthesopathy, unspecified: Secondary | ICD-10-CM | POA: Diagnosis not present

## 2015-06-11 DIAGNOSIS — L600 Ingrowing nail: Secondary | ICD-10-CM

## 2015-06-11 DIAGNOSIS — L84 Corns and callosities: Secondary | ICD-10-CM

## 2015-06-11 MED ORDER — TRIAMCINOLONE ACETONIDE 10 MG/ML IJ SUSP
10.0000 mg | Freq: Once | INTRAMUSCULAR | Status: AC
  Administered 2015-06-11: 10 mg

## 2015-06-11 NOTE — Progress Notes (Signed)
Subjective:     Patient ID: Dustin Jacobson, male   DOB: 27-May-1945, 70 y.o.   MRN: [id number]  HPI patient presents with painful second toe right foot with ingrown toenail on the medial border and inflammatory callus formation on the interphalangeal joint lateral side with pain and fluid buildup   Review of Systems     Objective:   Physical Exam Neurovascular status intact muscle strength was adequate with incurvated right second nail medial border that's painful and inflammatory changes with keratotic lesion at the interphalangeal joint right second toe    Assessment:     Ingrown toenail deformity right second toe and inflammatory capsular inflammation right interphalangeal joint second toe right    Plan:     H&P and x-rays reviewed with patient. Today I went ahead and I anesthetized the right second toe with 60 Milligan times like Marcaine mixture and remove the medial border right second toe and exposed matrix applying phenol 3 applications 30 seconds followed by alcohol lavage and sterile dressing. I then injected the interphalangeal joint right second toe lateral side 2 mg dexamethasone Kenalog to reduce inflammation debrided lesion and applied padding and reappoint as symptoms indicate

## 2015-06-11 NOTE — Patient Instructions (Signed)

## 2015-06-21 ENCOUNTER — Telehealth: Payer: Self-pay | Admitting: *Deleted

## 2015-06-21 NOTE — Telephone Encounter (Signed)
Left message for patient at [phone] (Home #) to check to see how they were doing from their ingrown toenail procedure that was performed on Monday, June 11, 2015. Waiting for a response.

## 2016-07-16 ENCOUNTER — Encounter (HOSPITAL_COMMUNITY)
Admission: RE | Admit: 2016-07-16 | Discharge: 2016-07-16 | Disposition: A | Discharge status: Home / Self Care | Payer: Medicare Other | Source: Ambulatory Visit | Attending: Orthopedic Surgery | Admitting: Orthopedic Surgery

## 2016-07-16 ENCOUNTER — Encounter (HOSPITAL_COMMUNITY): Payer: Self-pay

## 2016-07-16 ENCOUNTER — Encounter (HOSPITAL_COMMUNITY): Payer: Self-pay | Admitting: Physician Assistant

## 2016-07-16 DIAGNOSIS — M1712 Unilateral primary osteoarthritis, left knee: Secondary | ICD-10-CM | POA: Insufficient documentation

## 2016-07-16 DIAGNOSIS — Z01812 Encounter for preprocedural laboratory examination: Secondary | ICD-10-CM | POA: Diagnosis present

## 2016-07-16 HISTORY — DX: Personal history of urinary calculi: Z87.442

## 2016-07-16 HISTORY — DX: Unspecified cataract: H26.9

## 2016-07-16 LAB — CBC WITH DIFFERENTIAL/PLATELET
Basophils Absolute: 0 10*3/uL (ref 0.0–0.1)
Basophils Relative: 1 %
EOS ABS: 0.1 10*3/uL (ref 0.0–0.7)
EOS PCT: 2 %
HCT: 41 % (ref 39.0–52.0)
Hemoglobin: 13.5 g/dL (ref 13.0–17.0)
LYMPHS ABS: 2.1 10*3/uL (ref 0.7–4.0)
Lymphocytes Relative: 33 %
MCH: 29.5 pg (ref 26.0–34.0)
MCHC: 32.9 g/dL (ref 30.0–36.0)
MCV: 89.7 fL (ref 78.0–100.0)
MONOS PCT: 13 %
Monocytes Absolute: 0.9 10*3/uL (ref 0.1–1.0)
Neutro Abs: 3.4 10*3/uL (ref 1.7–7.7)
Neutrophils Relative %: 51 %
PLATELETS: 214 10*3/uL (ref 150–400)
RBC: 4.57 MIL/uL (ref 4.22–5.81)
RDW: 13.8 % (ref 11.5–15.5)
WBC: 6.5 10*3/uL (ref 4.0–10.5)

## 2016-07-16 LAB — COMPREHENSIVE METABOLIC PANEL
ALT: 12 U/L — ABNORMAL LOW (ref 17–63)
ANION GAP: 8 (ref 5–15)
AST: 20 U/L (ref 15–41)
Albumin: 3.7 g/dL (ref 3.5–5.0)
Alkaline Phosphatase: 74 U/L (ref 38–126)
BUN: 18 mg/dL (ref 6–20)
CALCIUM: 9 mg/dL (ref 8.9–10.3)
CHLORIDE: 106 mmol/L (ref 101–111)
CO2: 26 mmol/L (ref 22–32)
Creatinine, Ser: 1.18 mg/dL (ref 0.61–1.24)
GFR calc non Af Amer: >60 mL/min (ref >60)
Glucose, Bld: 106 mg/dL — ABNORMAL HIGH (ref 65–99)
POTASSIUM: 4 mmol/L (ref 3.5–5.1)
Sodium: 140 mmol/L (ref 135–145)
Total Bilirubin: 0.6 mg/dL (ref 0.3–1.2)
Total Protein: 6.6 g/dL (ref 6.5–8.1)

## 2016-07-16 LAB — TYPE AND SCREEN
ABO/RH(D): O POS
Antibody Screen: NEGATIVE

## 2016-07-16 LAB — ABO/RH: ABO/RH(D): O POS

## 2016-07-16 LAB — PROTIME-INR
INR: 1.04
Prothrombin Time: 13.6 seconds (ref 11.4–15.2)

## 2016-07-16 LAB — SURGICAL PCR SCREEN
MRSA, PCR: NEGATIVE
Staphylococcus aureus: NEGATIVE

## 2016-07-16 LAB — APTT: APTT: 30 s (ref 24–36)

## 2016-07-16 NOTE — Progress Notes (Signed)
Pt denies SOB, chest pain, and being under the care of a cardiologist. Pt denies having a cardiac cath and echo but stated that a stress test was performed > 10 years ago. Pt denies having an EKG and chest x ray within the last year. Pt denies having recent labs.

## 2016-07-16 NOTE — Pre-Procedure Instructions (Signed)
Zaylan Woosley Lejeune  07/16/2016      [place] [place] [id number] [place] [id number] Phone: [phone] Fax: [place], [place] [address] [place] [address] Phone: [phone] Fax: [phone]    Your procedure is scheduled on Monday, July 28, 2016  Report to [facility] Admitting at 5:30 A.M.  Call this number if you have problems the morning of surgery:  [phone]   Remember:  Do not eat food or drink liquids after midnight Sunday, July 27, 2016  Take these medicines the morning of surgery with A SIP OF WATER : None Stop taking Aspirin, vitamins, fish oil and herbal medications. Do not take any NSAIDs ie: Ibuprofen, Advil, Naproxen, BC and Goody Powder or any medication containing Aspirin such as meloxicam [facility]); stop Monday, July 21, 2016  Do not wear jewelry, make-up or nail polish.  Do not wear lotions, powders, or perfumes, or deoderant.  Do not shave 48 hours prior to surgery.  Men may shave face and neck.  Do not bring valuables to the hospital.  [facility] is not responsible for any belongings or valuables.  Contacts, dentures or bridgework may not be worn into surgery.  Leave your suitcase in the car.  After surgery it may be brought to your room.  For patients admitted to the hospital, discharge time will be determined by your treatment team.  Patients discharged the day of surgery will not be allowed to drive home.  Special instructions:   [place] - Preparing for Surgery  Before surgery, you can play an important role.  Because skin is not sterile, your skin needs to be as free of germs as possible.  You can reduce the number of germs on you skin by washing with CHG (chlorahexidine gluconate) soap before surgery.  CHG is an antiseptic cleaner which kills germs and bonds with the skin to continue killing germs even after  washing.  Please DO NOT use if you have an allergy to CHG or antibacterial soaps.  If your skin becomes reddened/irritated stop using the CHG and inform your nurse when you arrive at Short Stay.  Do not shave (including legs and underarms) for at least 48 hours prior to the first CHG shower.  You may shave your face.  Please follow these instructions carefully:   1.  Shower with CHG Soap the night before surgery and the morning of Surgery.  2.  If you choose to wash your hair, wash your hair first as usual with your normal shampoo.  3.  After you shampoo, rinse your hair and body thoroughly to remove the Shampoo.  4.  Use CHG as you would any other liquid soap.  You can apply chg directly  to the skin and wash gently with scrungie or a clean washcloth.  5.  Apply the CHG Soap to your body ONLY FROM THE NECK DOWN.  Do not use on open wounds or open sores.  Avoid contact with your eyes, ears, mouth and genitals (private parts).  Wash genitals (private parts) with your normal soap.  6.  Wash thoroughly, paying special attention to the area where your surgery will be performed.  7.  Thoroughly rinse your body with warm water from the neck down.  8.  DO NOT shower/wash with your normal soap after using and rinsing off the CHG Soap.  9.  Pat yourself dry with a clean towel.            10.  Wear clean pajamas.            11.  Place clean sheets on your bed the night of your first shower and do not sleep with pets.  Day of Surgery  Do not apply any lotions/deodorants the morning of surgery.  Please wear clean clothes to the hospital/surgery center.  Please read over the following fact sheets that you were given. Pain Booklet, Coughing and Deep Breathing, Blood Transfusion Information, Total Joint Packet, MRSA Information and Surgical Site Infection Prevention

## 2016-07-16 NOTE — H&P (Signed)
TOTAL KNEE ADMISSION H&P  Patient is being admitted for left total knee arthroplasty.  Subjective:  Chief Complaint:left knee pain.  HPI: Dustin Jacobson, 71 y.o. male, has a history of pain and functional disability in the left knee due to arthritis and has failed non-surgical conservative treatments for greater than 12 weeks to includeNSAID's and/or analgesics, corticosteriod injections, viscosupplementation injections, flexibility and strengthening excercises, supervised PT with diminished ADL's post treatment, use of assistive devices, weight reduction as appropriate and activity modification.  Onset of symptoms was gradual, starting 10 years ago with gradually worsening course since that time. The patient noted prior procedures on the knee to include  arthroscopy and menisectomy on the left knee(s).  Patient currently rates pain in the left knee(s) at 10 out of 10 with activity. Patient has night pain, worsening of pain with activity and weight bearing, pain that interferes with activities of daily living, crepitus and joint swelling.  Patient has evidence of subchondral sclerosis, periarticular osteophytes and joint space narrowing by imaging studies.  There is no active infection.  Patient Active Problem List   Diagnosis Date Noted  . Primary localized osteoarthritis of left knee   . GERD (gastroesophageal reflux disease) 03/31/2011  . Adenomatous colon polyp 03/31/2011  . Dysphagia 03/31/2011  . Schatzki's ring 03/31/2011   Past Medical History:  Diagnosis Date  . Adenomatous colon polyp 2002  . GERD (gastroesophageal reflux disease)   . Primary localized osteoarthritis of left knee   . S/P colonoscopy 05/15/2006   Dr Laurine Blazer anal canal hemorrhoids, otherwise normal  . Schatzki's ring 05/15/2006   Last EGD Dr Rourk-> dilated 37F, small hiatal hernia    Past Surgical History:  Procedure Laterality Date  . BUNIONECTOMY    . COLONOSCOPY  04/28/2011   Procedure: COLONOSCOPY;   Surgeon: Daneil Dolin, MD;  Location: [facility];  Service: Endoscopy;  Laterality: N/A;  10:15  . FINGER SURGERY    . INGUINAL HERNIA REPAIR     bilat  . KNEE SURGERY     bilat  . UMBILICAL HERNIA REPAIR      No current facility-administered medications for this encounter.   Current Outpatient Prescriptions:  .  meloxicam (MOBIC) 15 MG tablet, Take 15 mg by mouth daily as needed for pain., Disp: , Rfl:  .  cephALEXin (KEFLEX) 500 MG capsule, Take 1 capsule (500 mg total) by mouth 3 (three) times daily. (Patient not taking: Reported on 06/11/2015), Disp: 30 capsule, Rfl: 1   Allergies  Allergen Reactions  . Relafen [Nabumetone] Hives and Nausea And Vomiting    Dizziness     Social History  Substance Use Topics  . Smoking status: Never Smoker  . Smokeless tobacco: Not on file     Comment: quit about 40 yrs ago  . Alcohol use No     Comment: occassionally    Family History  Problem Relation Age of Onset  . Heart failure Mother   . Coronary artery disease Father   . Lung cancer Brother   . Anesthesia problems Neg Hx   . Hypotension Neg Hx   . Malignant hyperthermia Neg Hx   . Pseudochol deficiency Neg Hx      Review of Systems  Constitutional: Negative.   HENT: Negative.   Eyes: Negative.   Respiratory: Negative.   Cardiovascular: Negative.   Gastrointestinal: Negative.   Genitourinary: Negative.   Musculoskeletal: Positive for joint pain.  Skin: Negative.   Neurological: Negative.   Endo/Heme/Allergies: Negative.  Psychiatric/Behavioral: Negative.     Objective:  Physical Exam  Constitutional: He is oriented to person, place, and time. He appears well-developed and well-nourished.  HENT:  Head: Normocephalic and atraumatic.  Mouth/Throat: Oropharynx is clear and moist.  Eyes: Conjunctivae are normal. Pupils are equal, round, and reactive to light.  Neck: Neck supple.  Cardiovascular: Normal rate, regular rhythm and intact distal pulses.    Respiratory: Effort normal.  GI: Soft.  Genitourinary:  Genitourinary Comments: Not pertinent to current symptomatology therefore not examined.  Musculoskeletal:   Examination of his left knee reveals pain medially and laterally.  1+ synovitis.  1+ crepitation.  Range of motion -5 to 125 degrees.  Knee is stable with normal patella tracking.  Examination of his right knee reveals a moderate sized Bakers cyst posteriorly, but only mildly painful.  Full range of motion.  Knee is stable with normal patella tracking.  Vascular exam: Pulses are 2+ and symmetric.  Neurologic exam: Distal motor and sensory examination is within normal limits.    Neurological: He is alert and oriented to person, place, and time.  Skin: Skin is warm and dry.  Psychiatric: He has a normal mood and affect. His behavior is normal.    Vital signs in last 24 hours: Temp:  [97.8 F (36.6 C)] 97.8 F (36.6 C) (02/21 1400) Pulse Rate:  [69] 69 (02/21 1400) BP: (139)/(66) 139/66 (02/21 1400) SpO2:  [99 %] 99 % (02/21 1400) Weight:  [81.6 kg (180 lb)] 81.6 kg (180 lb) (02/21 1400)  Labs:   Estimated body mass index is 27.37 kg/m as calculated from the following:   Height as of this encounter: 5\' 8"  (1.727 m).   Weight as of this encounter: 81.6 kg (180 lb).   Imaging Review Plain radiographs demonstrate severe degenerative joint disease of the left knee(s). The overall alignment issignificant varus. The bone quality appears to be good for age and reported activity level.  Assessment/Plan:  End stage arthritis, left knee  Principal Problem:   Primary localized osteoarthritis of left knee Active Problems:   GERD (gastroesophageal reflux disease)   Adenomatous colon polyp   Dysphagia   Schatzki's ring   The patient history, physical examination, clinical judgment of the provider and imaging studies are consistent with end stage degenerative joint disease of the left knee(s) and total knee arthroplasty is  deemed medically necessary. The treatment options including medical management, injection therapy arthroscopy and arthroplasty were discussed at length. The risks and benefits of total knee arthroplasty were presented and reviewed. The risks due to aseptic loosening, infection, stiffness, patella tracking problems, thromboembolic complications and other imponderables were discussed. The patient acknowledged the explanation, agreed to proceed with the plan and consent was signed. Patient is being admitted for inpatient treatment for surgery, pain control, PT, OT, prophylactic antibiotics, VTE prophylaxis, progressive ambulation and ADL's and discharge planning. The patient is planning to be discharged home with home health services

## 2016-07-17 ENCOUNTER — Other Ambulatory Visit (HOSPITAL_COMMUNITY): Payer: Medicare PPO

## 2016-07-17 LAB — URINE CULTURE: Culture: NO GROWTH

## 2016-07-28 ENCOUNTER — Encounter (HOSPITAL_COMMUNITY): Payer: Self-pay | Admitting: Urology

## 2016-07-28 ENCOUNTER — Inpatient Hospital Stay (HOSPITAL_COMMUNITY): Payer: Medicare Other | Admitting: Certified Registered"

## 2016-07-28 ENCOUNTER — Encounter (HOSPITAL_COMMUNITY)
Admission: RE | Disposition: A | Discharge status: Home Health | Payer: Self-pay | Source: Ambulatory Visit | Attending: Orthopedic Surgery

## 2016-07-28 ENCOUNTER — Inpatient Hospital Stay (HOSPITAL_COMMUNITY)
Admission: RE | Admit: 2016-07-28 | Discharge: 2016-07-30 | DRG: 470 | Disposition: A | Discharge status: Home Health | Payer: Medicare Other | Source: Ambulatory Visit | Attending: Orthopedic Surgery | Admitting: Orthopedic Surgery

## 2016-07-28 DIAGNOSIS — K219 Gastro-esophageal reflux disease without esophagitis: Secondary | ICD-10-CM | POA: Diagnosis present

## 2016-07-28 DIAGNOSIS — K222 Esophageal obstruction: Secondary | ICD-10-CM | POA: Diagnosis present

## 2016-07-28 DIAGNOSIS — M1712 Unilateral primary osteoarthritis, left knee: Principal | ICD-10-CM | POA: Diagnosis present

## 2016-07-28 DIAGNOSIS — R1310 Dysphagia, unspecified: Secondary | ICD-10-CM

## 2016-07-28 DIAGNOSIS — D126 Benign neoplasm of colon, unspecified: Secondary | ICD-10-CM | POA: Diagnosis present

## 2016-07-28 DIAGNOSIS — M25562 Pain in left knee: Secondary | ICD-10-CM | POA: Diagnosis present

## 2016-07-28 HISTORY — DX: Unilateral primary osteoarthritis, left knee: M17.12

## 2016-07-28 HISTORY — PX: TOTAL KNEE ARTHROPLASTY: SHX125

## 2016-07-28 SURGERY — ARTHROPLASTY, KNEE, TOTAL
Anesthesia: Spinal | Site: Knee | Laterality: Left

## 2016-07-28 MED ORDER — OXYCODONE HCL 5 MG PO TABS
ORAL_TABLET | ORAL | Status: AC
  Filled 2016-07-28: qty 2

## 2016-07-28 MED ORDER — PHENOL 1.4 % MT LIQD: 1.0000 | OROMUCOSAL | Status: DC | PRN

## 2016-07-28 MED ORDER — CEFAZOLIN SODIUM-DEXTROSE 2-4 GM/100ML-% IV SOLN
2.0000 g | INTRAVENOUS | Status: AC
  Administered 2016-07-28: 2 g via INTRAVENOUS

## 2016-07-28 MED ORDER — LACTATED RINGERS IV SOLN: INTRAVENOUS | Status: DC

## 2016-07-28 MED ORDER — SODIUM CHLORIDE 0.9 % IR SOLN
Status: DC | PRN
  Administered 2016-07-28: 3000 mL

## 2016-07-28 MED ORDER — ASPIRIN EC 325 MG PO TBEC
325.0000 mg | DELAYED_RELEASE_TABLET | Freq: Every day | ORAL | Status: DC
  Administered 2016-07-29 – 2016-07-30 (×2): 325 mg via ORAL
  Filled 2016-07-28 (×2): qty 1

## 2016-07-28 MED ORDER — ALUM & MAG HYDROXIDE-SIMETH 200-200-20 MG/5ML PO SUSP
30.0000 mL | ORAL | Status: DC | PRN
  Administered 2016-07-29: 30 mL via ORAL
  Filled 2016-07-28: qty 30

## 2016-07-28 MED ORDER — MELOXICAM 7.5 MG PO TABS
15.0000 mg | ORAL_TABLET | Freq: Every day | ORAL | Status: DC
  Administered 2016-07-28 – 2016-07-30 (×3): 15 mg via ORAL
  Filled 2016-07-28 (×3): qty 2

## 2016-07-28 MED ORDER — BUPIVACAINE HCL (PF) 0.25 % IJ SOLN
INTRAMUSCULAR | Status: AC
  Filled 2016-07-28: qty 30

## 2016-07-28 MED ORDER — ROPIVACAINE HCL 7.5 MG/ML IJ SOLN
INTRAMUSCULAR | Status: DC | PRN
  Administered 2016-07-28: 20 mL via PERINEURAL

## 2016-07-28 MED ORDER — ONDANSETRON HCL 4 MG PO TABS: 4.0000 mg | ORAL_TABLET | Freq: Four times a day (QID) | ORAL | Status: DC | PRN

## 2016-07-28 MED ORDER — HYDROMORPHONE HCL 1 MG/ML IJ SOLN
INTRAMUSCULAR | Status: AC
  Filled 2016-07-28: qty 1

## 2016-07-28 MED ORDER — MEPERIDINE HCL 25 MG/ML IJ SOLN: 6.2500 mg | INTRAMUSCULAR | Status: DC | PRN

## 2016-07-28 MED ORDER — ACETAMINOPHEN 325 MG PO TABS: 650.0000 mg | ORAL_TABLET | Freq: Four times a day (QID) | ORAL | Status: DC | PRN

## 2016-07-28 MED ORDER — POVIDONE-IODINE 7.5 % EX SOLN: Freq: Once | CUTANEOUS | Status: DC

## 2016-07-28 MED ORDER — HYDROMORPHONE HCL 2 MG/ML IJ SOLN: 1.0000 mg | INTRAMUSCULAR | Status: DC | PRN

## 2016-07-28 MED ORDER — FENTANYL CITRATE (PF) 100 MCG/2ML IJ SOLN
INTRAMUSCULAR | Status: DC | PRN
  Administered 2016-07-28 (×2): 50 ug via INTRAVENOUS

## 2016-07-28 MED ORDER — LIDOCAINE 2% (20 MG/ML) 5 ML SYRINGE
INTRAMUSCULAR | Status: AC
  Filled 2016-07-28: qty 5

## 2016-07-28 MED ORDER — FENTANYL CITRATE (PF) 100 MCG/2ML IJ SOLN
INTRAMUSCULAR | Status: AC
  Filled 2016-07-28: qty 2

## 2016-07-28 MED ORDER — DEXAMETHASONE SODIUM PHOSPHATE 10 MG/ML IJ SOLN
10.0000 mg | Freq: Three times a day (TID) | INTRAMUSCULAR | Status: AC
  Administered 2016-07-28 – 2016-07-29 (×3): 10 mg via INTRAVENOUS
  Filled 2016-07-28 (×3): qty 1

## 2016-07-28 MED ORDER — LACTATED RINGERS IV SOLN
INTRAVENOUS | Status: DC | PRN
  Administered 2016-07-28 (×2): via INTRAVENOUS

## 2016-07-28 MED ORDER — CEFAZOLIN SODIUM-DEXTROSE 2-4 GM/100ML-% IV SOLN
INTRAVENOUS | Status: AC
  Filled 2016-07-28: qty 100

## 2016-07-28 MED ORDER — CHLORHEXIDINE GLUCONATE 4 % EX LIQD: 60.0000 mL | Freq: Once | CUTANEOUS | Status: DC

## 2016-07-28 MED ORDER — POLYETHYLENE GLYCOL 3350 17 G PO PACK
17.0000 g | PACK | Freq: Two times a day (BID) | ORAL | Status: DC
  Administered 2016-07-28 – 2016-07-30 (×5): 17 g via ORAL
  Filled 2016-07-28 (×5): qty 1

## 2016-07-28 MED ORDER — MIDAZOLAM HCL 5 MG/5ML IJ SOLN
INTRAMUSCULAR | Status: DC | PRN
  Administered 2016-07-28: 2 mg via INTRAVENOUS

## 2016-07-28 MED ORDER — DEXAMETHASONE SODIUM PHOSPHATE 10 MG/ML IJ SOLN
INTRAMUSCULAR | Status: DC | PRN
  Administered 2016-07-28: 10 mg via INTRAVENOUS

## 2016-07-28 MED ORDER — PROPOFOL 10 MG/ML IV BOLUS
INTRAVENOUS | Status: AC
  Filled 2016-07-28: qty 20

## 2016-07-28 MED ORDER — EPINEPHRINE PF 1 MG/ML IJ SOLN
INTRAMUSCULAR | Status: AC
  Filled 2016-07-28: qty 1

## 2016-07-28 MED ORDER — MENTHOL 3 MG MT LOZG: 1.0000 | LOZENGE | OROMUCOSAL | Status: DC | PRN

## 2016-07-28 MED ORDER — PROPOFOL 500 MG/50ML IV EMUL
INTRAVENOUS | Status: DC | PRN
  Administered 2016-07-28: 50 ug/kg/min via INTRAVENOUS

## 2016-07-28 MED ORDER — ACETAMINOPHEN 650 MG RE SUPP: 650.0000 mg | Freq: Four times a day (QID) | RECTAL | Status: DC | PRN

## 2016-07-28 MED ORDER — ONDANSETRON HCL 4 MG/2ML IJ SOLN
INTRAMUSCULAR | Status: DC | PRN
  Administered 2016-07-28: 4 mg via INTRAVENOUS

## 2016-07-28 MED ORDER — SODIUM CHLORIDE 0.9 % IV SOLN
1000.0000 mg | INTRAVENOUS | Status: AC
  Administered 2016-07-28: 1000 mg via INTRAVENOUS
  Filled 2016-07-28 (×2): qty 10

## 2016-07-28 MED ORDER — METOCLOPRAMIDE HCL 5 MG PO TABS: 5.0000 mg | ORAL_TABLET | Freq: Three times a day (TID) | ORAL | Status: DC | PRN

## 2016-07-28 MED ORDER — POTASSIUM CHLORIDE IN NACL 20-0.9 MEQ/L-% IV SOLN
INTRAVENOUS | Status: DC
Start: 2016-07-28 — End: 2016-07-29
  Administered 2016-07-28: 14:00:00 via INTRAVENOUS
  Filled 2016-07-28: qty 1000

## 2016-07-28 MED ORDER — FENTANYL CITRATE (PF) 100 MCG/2ML IJ SOLN
25.0000 ug | INTRAMUSCULAR | Status: DC | PRN
  Administered 2016-07-28 (×2): 50 ug via INTRAVENOUS

## 2016-07-28 MED ORDER — 0.9 % SODIUM CHLORIDE (POUR BTL) OPTIME
TOPICAL | Status: DC | PRN
  Administered 2016-07-28: 1000 mL

## 2016-07-28 MED ORDER — ONDANSETRON HCL 4 MG/2ML IJ SOLN
INTRAMUSCULAR | Status: AC
  Filled 2016-07-28: qty 2

## 2016-07-28 MED ORDER — ACETAMINOPHEN 500 MG PO TABS
1000.0000 mg | ORAL_TABLET | Freq: Four times a day (QID) | ORAL | Status: AC
  Administered 2016-07-28 – 2016-07-29 (×4): 1000 mg via ORAL
  Filled 2016-07-28 (×5): qty 2

## 2016-07-28 MED ORDER — DOCUSATE SODIUM 100 MG PO CAPS
100.0000 mg | ORAL_CAPSULE | Freq: Two times a day (BID) | ORAL | Status: DC
  Administered 2016-07-28 – 2016-07-30 (×5): 100 mg via ORAL
  Filled 2016-07-28 (×5): qty 1

## 2016-07-28 MED ORDER — DEXAMETHASONE SODIUM PHOSPHATE 10 MG/ML IJ SOLN
INTRAMUSCULAR | Status: AC
  Filled 2016-07-28: qty 1

## 2016-07-28 MED ORDER — HYDROMORPHONE HCL 1 MG/ML IJ SOLN
1.0000 mg | INTRAMUSCULAR | Status: DC | PRN
  Administered 2016-07-28: 1 mg via INTRAVENOUS

## 2016-07-28 MED ORDER — CEFAZOLIN SODIUM-DEXTROSE 2-4 GM/100ML-% IV SOLN
2.0000 g | Freq: Four times a day (QID) | INTRAVENOUS | Status: AC
  Administered 2016-07-28 (×2): 2 g via INTRAVENOUS
  Filled 2016-07-28 (×2): qty 100

## 2016-07-28 MED ORDER — DIPHENHYDRAMINE HCL 12.5 MG/5ML PO ELIX
12.5000 mg | ORAL_SOLUTION | ORAL | Status: DC | PRN
  Administered 2016-07-28: 25 mg via ORAL
  Filled 2016-07-28: qty 10

## 2016-07-28 MED ORDER — METOCLOPRAMIDE HCL 5 MG/ML IJ SOLN
5.0000 mg | Freq: Three times a day (TID) | INTRAMUSCULAR | Status: DC | PRN
  Administered 2016-07-29: 10 mg via INTRAVENOUS
  Filled 2016-07-28: qty 2

## 2016-07-28 MED ORDER — ONDANSETRON HCL 4 MG/2ML IJ SOLN
4.0000 mg | Freq: Four times a day (QID) | INTRAMUSCULAR | Status: DC | PRN
  Administered 2016-07-28: 4 mg via INTRAVENOUS
  Filled 2016-07-28: qty 2

## 2016-07-28 MED ORDER — BUPIVACAINE IN DEXTROSE 0.75-8.25 % IT SOLN
INTRATHECAL | Status: DC | PRN
  Administered 2016-07-28: 1.5 mL via INTRATHECAL

## 2016-07-28 MED ORDER — BUPIVACAINE-EPINEPHRINE 0.25% -1:200000 IJ SOLN
INTRAMUSCULAR | Status: DC | PRN
  Administered 2016-07-28: 30 mL

## 2016-07-28 MED ORDER — OXYCODONE HCL 5 MG PO TABS
5.0000 mg | ORAL_TABLET | ORAL | Status: DC | PRN
  Administered 2016-07-28 – 2016-07-30 (×11): 10 mg via ORAL
  Filled 2016-07-28 (×11): qty 2

## 2016-07-28 MED ORDER — MIDAZOLAM HCL 2 MG/2ML IJ SOLN
INTRAMUSCULAR | Status: AC
  Filled 2016-07-28: qty 2

## 2016-07-28 MED ORDER — METOCLOPRAMIDE HCL 5 MG/ML IJ SOLN: 10.0000 mg | Freq: Once | INTRAMUSCULAR | Status: DC | PRN

## 2016-07-28 SURGICAL SUPPLY — 78 items
APL SKNCLS STERI-STRIP NONHPOA (GAUZE/BANDAGES/DRESSINGS) ×1
BANDAGE ESMARK 6X9 LF (GAUZE/BANDAGES/DRESSINGS) ×1 IMPLANT
BENZOIN TINCTURE PRP APPL 2/3 (GAUZE/BANDAGES/DRESSINGS) ×3 IMPLANT
BLADE SAGITTAL 25.0X1.19X90 (BLADE) ×2 IMPLANT
BLADE SAGITTAL 25.0X1.19X90MM (BLADE) ×1
BLADE SAW SGTL 13X75X1.27 (BLADE) ×3 IMPLANT
BLADE SURG 10 STRL SS (BLADE) ×6 IMPLANT
BNDG CMPR 9X6 STRL LF SNTH (GAUZE/BANDAGES/DRESSINGS) ×1
BNDG CMPR MED 15X6 ELC VLCR LF (GAUZE/BANDAGES/DRESSINGS) ×1
BNDG ELASTIC 6X15 VLCR STRL LF (GAUZE/BANDAGES/DRESSINGS) ×3 IMPLANT
BNDG ESMARK 6X9 LF (GAUZE/BANDAGES/DRESSINGS) ×3
BOWL SMART MIX CTS (DISPOSABLE) ×3 IMPLANT
CAPT KNEE TOTAL 3 ATTUNE ×2 IMPLANT
CEMENT HV SMART SET (Cement) ×6 IMPLANT
CLOSURE WOUND 1/2 X4 (GAUZE/BANDAGES/DRESSINGS) ×1
COVER SURGICAL LIGHT HANDLE (MISCELLANEOUS) ×3 IMPLANT
CUFF TOURNIQUET SINGLE 34IN LL (TOURNIQUET CUFF) ×3 IMPLANT
CUFF TOURNIQUET SINGLE 44IN (TOURNIQUET CUFF) IMPLANT
DECANTER SPIKE VIAL GLASS SM (MISCELLANEOUS) ×3 IMPLANT
DRAPE EXTREMITY T 121X128X90 (DRAPE) ×3 IMPLANT
DRAPE HALF SHEET 40X57 (DRAPES) ×3 IMPLANT
DRAPE INCISE IOBAN 66X45 STRL (DRAPES) IMPLANT
DRAPE PROXIMA HALF (DRAPES) ×3 IMPLANT
DRAPE U-SHAPE 47X51 STRL (DRAPES) ×3 IMPLANT
DRSG AQUACEL AG ADV 3.5X14 (GAUZE/BANDAGES/DRESSINGS) ×3 IMPLANT
DURAPREP 26ML APPLICATOR (WOUND CARE) ×6 IMPLANT
ELECT CAUTERY BLADE 6.4 (BLADE) ×3 IMPLANT
ELECT REM PT RETURN 9FT ADLT (ELECTROSURGICAL) ×3
ELECTRODE REM PT RTRN 9FT ADLT (ELECTROSURGICAL) ×1 IMPLANT
FACESHIELD WRAPAROUND (MASK) ×6 IMPLANT
FACESHIELD WRAPAROUND OR TEAM (MASK) ×1 IMPLANT
FILTER STRAW FLUID ASPIR (MISCELLANEOUS) ×2 IMPLANT
GLOVE BIO SURGEON STRL SZ7 (GLOVE) ×3 IMPLANT
GLOVE BIOGEL PI IND STRL 7.0 (GLOVE) ×1 IMPLANT
GLOVE BIOGEL PI IND STRL 7.5 (GLOVE) ×1 IMPLANT
GLOVE BIOGEL PI INDICATOR 7.0 (GLOVE) ×2
GLOVE BIOGEL PI INDICATOR 7.5 (GLOVE) ×2
GLOVE SS BIOGEL STRL SZ 7.5 (GLOVE) ×1 IMPLANT
GLOVE SUPERSENSE BIOGEL SZ 7.5 (GLOVE) ×2
GOWN STRL REUS W/ TWL LRG LVL3 (GOWN DISPOSABLE) ×1 IMPLANT
GOWN STRL REUS W/ TWL XL LVL3 (GOWN DISPOSABLE) ×2 IMPLANT
GOWN STRL REUS W/TWL LRG LVL3 (GOWN DISPOSABLE) ×3
GOWN STRL REUS W/TWL XL LVL3 (GOWN DISPOSABLE) ×6
HANDPIECE INTERPULSE COAX TIP (DISPOSABLE) ×6
HOOD PEEL AWAY FACE SHEILD DIS (HOOD) ×6 IMPLANT
IMMOBILIZER KNEE 22 UNIV (SOFTGOODS) ×3 IMPLANT
KIT BASIN OR (CUSTOM PROCEDURE TRAY) ×3 IMPLANT
KIT ROOM TURNOVER OR (KITS) ×3 IMPLANT
MANIFOLD NEPTUNE II (INSTRUMENTS) ×3 IMPLANT
MARKER SKIN DUAL TIP RULER LAB (MISCELLANEOUS) ×3 IMPLANT
NDL 18GX1X1/2 (RX/OR ONLY) (NEEDLE) ×1 IMPLANT
NDL FILTER BLUNT 18X1 1/2 (NEEDLE) IMPLANT
NEEDLE 18GX1X1/2 (RX/OR ONLY) (NEEDLE) ×3 IMPLANT
NEEDLE FILTER BLUNT 18X 1/2SAF (NEEDLE) ×2
NEEDLE FILTER BLUNT 18X1 1/2 (NEEDLE) ×1 IMPLANT
NS IRRIG 1000ML POUR BTL (IV SOLUTION) ×3 IMPLANT
PACK TOTAL JOINT (CUSTOM PROCEDURE TRAY) ×3 IMPLANT
PAD ARMBOARD 7.5X6 YLW CONV (MISCELLANEOUS) ×6 IMPLANT
SET HNDPC FAN SPRY TIP SCT (DISPOSABLE) ×1 IMPLANT
STRIP CLOSURE SKIN 1/2X4 (GAUZE/BANDAGES/DRESSINGS) ×2 IMPLANT
SUCTION FRAZIER HANDLE 10FR (MISCELLANEOUS) ×2
SUCTION TUBE FRAZIER 10FR DISP (MISCELLANEOUS) ×1 IMPLANT
SUT MNCRL AB 3-0 PS2 18 (SUTURE) ×3 IMPLANT
SUT VIC AB 0 CT1 27 (SUTURE) ×6
SUT VIC AB 0 CT1 27XBRD ANBCTR (SUTURE) ×2 IMPLANT
SUT VIC AB 1 CT1 27 (SUTURE) ×3
SUT VIC AB 1 CT1 27XBRD ANBCTR (SUTURE) ×1 IMPLANT
SUT VIC AB 2-0 CT1 27 (SUTURE) ×6
SUT VIC AB 2-0 CT1 TAPERPNT 27 (SUTURE) ×2 IMPLANT
SYR 30ML LL (SYRINGE) ×3 IMPLANT
SYR TB 1ML LUER SLIP (SYRINGE) ×2 IMPLANT
TOWEL OR 17X24 6PK STRL BLUE (TOWEL DISPOSABLE) ×3 IMPLANT
TOWEL OR 17X26 10 PK STRL BLUE (TOWEL DISPOSABLE) ×3 IMPLANT
TRAY CATH 16FR W/PLASTIC CATH (SET/KITS/TRAYS/PACK) IMPLANT
TRAY FOLEY CATH 16FR SILVER (SET/KITS/TRAYS/PACK) ×3 IMPLANT
TUBE CONNECTING 12'X1/4 (SUCTIONS) ×1
TUBE CONNECTING 12X1/4 (SUCTIONS) ×2 IMPLANT
YANKAUER SUCT BULB TIP NO VENT (SUCTIONS) ×5 IMPLANT

## 2016-07-28 NOTE — Anesthesia Preprocedure Evaluation (Addendum)
Anesthesia Evaluation  Patient identified by MRN, date of birth, ID band Patient awake    Reviewed: Allergy & Precautions, NPO status , Patient's Chart, lab work & pertinent test results  Airway Mallampati: II  TM Distance: >3 FB Neck ROM: Full    Dental no notable dental hx.    Pulmonary neg pulmonary ROS,    Pulmonary exam normal breath sounds clear to auscultation       Cardiovascular negative cardio ROS Normal cardiovascular exam Rhythm:Regular Rate:Normal     Neuro/Psych negative neurological ROS  negative psych ROS   GI/Hepatic negative GI ROS, Neg liver ROS,   Endo/Other  negative endocrine ROS  Renal/GU negative Renal ROS  negative genitourinary   Musculoskeletal negative musculoskeletal ROS (+)   Abdominal   Peds negative pediatric ROS (+)  Hematology negative hematology ROS (+)   Anesthesia Other Findings   Reproductive/Obstetrics negative OB ROS                             Anesthesia Physical Anesthesia Plan  ASA: II  Anesthesia Plan: Spinal   Post-op Pain Management:  Regional for Post-op pain   Induction:   Airway Management Planned: Simple Face Mask  Additional Equipment:   Intra-op Plan:   Post-operative Plan:   Informed Consent: I have reviewed the patients History and Physical, chart, labs and discussed the procedure including the risks, benefits and alternatives for the proposed anesthesia with the patient or authorized representative who has indicated his/her understanding and acceptance.   Dental advisory given  Plan Discussed with: CRNA  Anesthesia Plan Comments: (Adductor canal block)       Anesthesia Quick Evaluation

## 2016-07-28 NOTE — Anesthesia Procedure Notes (Signed)
Spinal  Patient location during procedure: OR Staffing Anesthesiologist: [name] Performed: anesthesiologist  Preanesthetic Checklist Completed: patient identified, site marked, surgical consent, pre-op evaluation, timeout performed, IV checked, risks and benefits discussed and monitors and equipment checked Spinal Block Patient position: sitting Prep: Betadine Patient monitoring: heart rate, continuous pulse ox and blood pressure Approach: right paramedian Location: L4-5 Injection technique: single-shot Needle Needle type: Sprotte  Needle gauge: 24 G Needle length: 9 cm Additional Notes Expiration date of kit checked and confirmed. Patient tolerated procedure well, without complications.

## 2016-07-28 NOTE — Anesthesia Preprocedure Evaluation (Addendum)
Anesthesia Evaluation  Patient identified by MRN, date of birth, ID band Patient awake    Reviewed: Allergy & Precautions, NPO status , Patient's Chart, lab work & pertinent test results  Airway Mallampati: II  TM Distance: >3 FB Neck ROM: Full    Dental no notable dental hx. (+) Teeth Intact, Dental Advisory Given   Pulmonary neg pulmonary ROS,    Pulmonary exam normal breath sounds clear to auscultation       Cardiovascular negative cardio ROS Normal cardiovascular exam Rhythm:Regular Rate:Normal     Neuro/Psych negative neurological ROS  negative psych ROS   GI/Hepatic Neg liver ROS, GERD  ,  Endo/Other  negative endocrine ROS  Renal/GU negative Renal ROS  negative genitourinary   Musculoskeletal  (+) Arthritis ,   Abdominal   Peds negative pediatric ROS (+)  Hematology negative hematology ROS (+)   Anesthesia Other Findings   Reproductive/Obstetrics negative OB ROS                           Anesthesia Physical Anesthesia Plan  ASA: II  Anesthesia Plan: Spinal   Post-op Pain Management:  Regional for Post-op pain   Induction: Intravenous  Airway Management Planned: Simple Face Mask  Additional Equipment:   Intra-op Plan:   Post-operative Plan:   Informed Consent: I have reviewed the patients History and Physical, chart, labs and discussed the procedure including the risks, benefits and alternatives for the proposed anesthesia with the patient or authorized representative who has indicated his/her understanding and acceptance.   Dental advisory given  Plan Discussed with: CRNA  Anesthesia Plan Comments: (adductor)        Anesthesia Quick Evaluation

## 2016-07-28 NOTE — Evaluation (Signed)
Physical Therapy Evaluation Patient Details Name: Dustin Jacobson MRN: [id number] DOB: 07-Sep-1945 Today's Date: 07/28/2016   History of Present Illness  Admitted for LTKA, [name];  has a past medical history of Adenomatous colon polyp (2002); Early cataract;  has a past surgical history that includes Knee surgery; Finger surgery; Bunionectomy  Clinical Impression   Pt is s/p TKA resulting in the deficits listed below (see PT Problem List). Overall moving well; needs focus on knee extension range; dc tomorrow is not unreasonable; Pt will benefit from skilled PT to increase their independence and safety with mobility to allow discharge to the venue listed below.      Follow Up Recommendations Home health PT;Supervision/Assistance - 24 hour    Equipment Recommendations  Rolling walker with 5" wheels;3in1 (PT)    Recommendations for Other Services       Precautions / Restrictions Precautions Precautions: Knee Precaution Booklet Issued: Yes (comment) Precaution Comments: Pt educated to not allow any pillow or bolster under knee for healing with optimal range of motion.  Required Braces or Orthoses: Knee Immobilizer - Left Knee Immobilizer - Left: On when out of bed or walking Restrictions Weight Bearing Restrictions: Yes LLE Weight Bearing: Weight bearing as tolerated      Mobility  Bed Mobility Overal bed mobility: Needs Assistance Bed Mobility: Supine to Sit     Supine to sit: Supervision     General bed mobility comments: Cues for technique  Transfers Overall transfer level: Needs assistance Equipment used: Rolling walker (2 wheeled) Transfers: Sit to/from Stand Sit to Stand: Supervision         General transfer comment: Cues for hand placement and safety  Ambulation/Gait Ambulation/Gait assistance: Min guard Ambulation Distance (Feet): 80 Feet Assistive device: Rolling walker (2 wheeled) Gait Pattern/deviations: Step-through pattern     General Gait Details:  Cues to activate L quad for stance stability  Stairs            Wheelchair Mobility    Modified Rankin (Stroke Patients Only)       Balance                                             Pertinent Vitals/Pain Pain Assessment: 0-10 Pain Score: 6  Pain Location: L knee Pain Descriptors / Indicators: Aching Pain Intervention(s): Monitored during session    Home Living Family/patient expects to be discharged to:: Private residence Living Arrangements: Spouse/significant other Available Help at Discharge: Family;Available PRN/intermittently Type of Home: House Home Access: Stairs to enter Entrance Stairs-Rails: None Entrance Stairs-Number of Steps: 3 Home Layout: Two level;Able to live on main level with bedroom/bathroom Home Equipment: Walker - 2 wheels      Prior Function Level of Independence: Independent               Hand Dominance        Extremity/Trunk Assessment   Upper Extremity Assessment Upper Extremity Assessment: Overall WFL for tasks assessed    Lower Extremity Assessment Lower Extremity Assessment: LLE deficits/detail LLE Deficits / Details: Grossly decr aROM and strength knee, limited by pain; range approx 8-70 deg       Communication   Communication: No difficulties  Cognition Arousal/Alertness: Awake/alert Behavior During Therapy: WFL for tasks assessed/performed Overall Cognitive Status: Within Functional Limits for tasks assessed  General Comments      Exercises     Assessment/Plan    PT Assessment Patient needs continued PT services  PT Problem List Decreased strength;Decreased range of motion;Decreased mobility;Decreased knowledge of use of DME;Decreased knowledge of precautions;Pain       PT Treatment Interventions DME instruction;Gait training;Stair training;Functional mobility training;Therapeutic activities;Therapeutic exercise;Patient/family education    PT Goals  (Current goals can be found in the Care Plan section)  Acute Rehab PT Goals Patient Stated Goal: back to hiking, riding horses, chasing 71 yo granschild PT Goal Formulation: With patient Time For Goal Achievement: 08/04/16 Potential to Achieve Goals: Good    Frequency 7X/week   Barriers to discharge        Co-evaluation               End of Session Equipment Utilized During Treatment: Gait belt Activity Tolerance: Patient tolerated treatment well Patient left: in chair;with call bell/phone within reach Nurse Communication: Mobility status PT Visit Diagnosis: Pain Pain - Right/Left: Left Pain - part of body: Knee         Time: [id number] PT Time Calculation (min) (ACUTE ONLY): 36 min   Charges:   PT Evaluation $PT Eval Low Complexity: 1 Procedure PT Treatments $Gait Training: 8-22 mins   PT G CodesColletta Maryland 07/28/2016, 4:36 PM  Roney [place], [place] Pager [phone] Office [phone]

## 2016-07-28 NOTE — Progress Notes (Signed)
Orthopedic Tech Progress Note Patient Details:  Dustin Jacobson [birth year] [id number]  CPM Left Knee CPM Left Knee: On Left Knee Flexion (Degrees): 90 Left Knee Extension (Degrees): 0 Additional Comments: Applied CPM at 0-90 on pt Left Knee.  Pt tolerated well.  Provided bone foam zero degree at bedside.  (Left Knee)   [name] 07/28/2016, 10:18 AM

## 2016-07-28 NOTE — Transfer of Care (Signed)
Immediate Anesthesia Transfer of Care Note  Patient: Dustin Jacobson  Procedure(s) Performed: Procedure(s): TOTAL KNEE ARTHROPLASTY (Left)  Patient Location: PACU  Anesthesia Type:MAC and Spinal  Level of Consciousness: awake, alert  and oriented  Airway & Oxygen Therapy: Patient Spontanous Breathing  Post-op Assessment: Report given to RN  Post vital signs: Reviewed and stable  Last Vitals:  Vitals:   07/28/16 0550 07/28/16 0930  BP: (!) 143/73   Pulse: 65 60  Resp: 20 14  Temp: 36.6 C 36.7 C    Last Pain:  Vitals:   07/28/16 0550  TempSrc: Oral      Patients Stated Pain Goal: 5 (42/87/68 1157)  Complications: No apparent anesthesia complications

## 2016-07-28 NOTE — Anesthesia Procedure Notes (Addendum)
Anesthesia Regional Block: Adductor canal block   Pre-Anesthetic Checklist: ,, timeout performed, Correct Patient, Correct Site, Correct Laterality, Correct Procedure, Correct Position, site marked, Risks and benefits discussed,  Surgical consent,  Pre-op evaluation,  At surgeon's request and post-op pain management  Laterality: Left and Lower  Prep: Maximum Sterile Barrier Precautions used, chloraprep       Needles:  Injection technique: Single-shot  Needle Type: Echogenic Stimulator Needle     Needle Length: 10cm      Additional Needles:   Procedures: ultrasound guided,,,,,,,,  Narrative:  Start time: 07/28/2016 6:56 AM End time: 07/28/2016 7:06 AM Injection made incrementally with aspirations every 5 mL.  Performed by: Personally  Anesthesiologist: [name]  Additional Notes: Risks, benefits and alternative to block explained extensively.  Patient tolerated procedure well, without complications.

## 2016-07-28 NOTE — Op Note (Signed)
MRN:     [id number] DOB/AGE:    [birth year] / 70 y.o.       OPERATIVE REPORT    DATE OF PROCEDURE:  07/28/2016       PREOPERATIVE DIAGNOSIS:  PRIMARY LOCALIZED OA LEFT KNEE      Estimated body mass index is 27.37 kg/m as calculated from the following:   Height as of this encounter: 5\' 8"  (1.727 m).   Weight as of this encounter: 180 lb (81.6 kg).                                                        POSTOPERATIVE DIAGNOSIS:   SAME                                                                     PROCEDURE:  Procedure(s): TOTAL KNEE ARTHROPLASTY Using Depuy Attune RP implants #8 Femur, #8Tibia, 26mm  RP bearing, 38 Patella     SURGEON: Demaris Leavell A    ASSISTANT:  Kirstin Shepperson PA-C   (Present and scrubbed throughout the case, critical for assistance with exposure, retraction, instrumentation, and closure.)         ANESTHESIA: Spinal with Adductor Nerve Block     TOURNIQUET TIME: [id number]   COMPLICATIONS:  None     SPECIMENS: None   INDICATIONS FOR PROCEDURE: The patient has  OA LEFT KNEE, varus deformities, XR shows bone on bone arthritis. Patient has failed all conservative measures including anti-inflammatory medicines, narcotics, attempts at  exercise and weight loss, cortisone injections and viscosupplementation.  Risks and benefits of surgery have been discussed, questions answered.   DESCRIPTION OF PROCEDURE: The patient identified by armband, received  right femoral nerve block and IV antibiotics, in the holding area at [facility]. Patient taken to the operating room, appropriate anesthetic  monitors were attached Spianl anesthesia induced with  the patient in supine position, Foley catheter was inserted. Tourniquet  applied high to the operative thigh. Lateral post and foot positioner  applied to the table, the lower extremity was then prepped and draped  in usual sterile fashion from the ankle to the tourniquet. Time-out procedure was performed. The limb was  wrapped with an Esmarch bandage and the tourniquet inflated to 365 mmHg. We began the operation by making the anterior midline incision starting at handbreadth above the patella going over the patella 1 cm medial to and  4 cm distal to the tibial tubercle. Small bleeders in the skin and the  subcutaneous tissue identified and cauterized. Transverse retinaculum was incised and reflected medially and a medial parapatellar arthrotomy was accomplished. the patella was everted and theprepatellar fat pad resected. The superficial medial collateral  ligament was then elevated from anterior to posterior along the proximal  flare of the tibia and anterior half of the menisci resected. The knee was hyperflexed exposing bone on bone arthritis. Peripheral and notch osteophytes as well as the cruciate ligaments were then resected. We continued to  work our way around posteriorly along the proximal tibia, and externally  rotated the tibia subluxing it out from underneath  the femur. A McHale  retractor was placed through the notch and a lateral Hohmann retractor  placed, and we then drilled through the proximal tibia in line with the  axis of the tibia followed by an intramedullary guide rod and 2-degree  posterior slope cutting guide. The tibial cutting guide was pinned into place  allowing resection of 4 mm of bone medially and about 6 mm of bone  laterally because of her varus deformity. Satisfied with the tibial resection, we then  entered the distal femur 2 mm anterior to the PCL origin with the  intramedullary guide rod and applied the distal femoral cutting guide  set at 53mm, with 5 degrees of valgus. This was pinned along the  epicondylar axis. At this point, the distal femoral cut was accomplished without difficulty. We then sized for a #8 femoral component and pinned the guide in 3 degrees of external rotation.The chamfer cutting guide was pinned into place. The anterior, posterior, and chamfer cuts were  accomplished without difficulty followed by  the  RP box cutting guide and the box cut. We also removed posterior osteophytes from the posterior femoral condyles. At this  time, the knee was brought into full extension. We checked our  extension and flexion gaps and found them symmetric at 71mm.  The patella thickness measured at 25 mm. We set the cutting guide at 15 and removed the posterior 9.5-10 mm  of the patella sized for 38 button and drilled the lollipop. The knee  was then once again hyperflexed exposing the proximal tibia. We sized for a #8 tibial base plate, applied the smokestack and the conical reamer followed by the the Delta fin keel punch. We then hammered into place the  RP trial femoral component, inserted a 1 trial bearing, trial patellar button, and took the knee through range of motion from 0-130 degrees. No thumb pressure was required for patellar  tracking. At this point, all trial components were removed, a double batch of DePuy HV cement  was mixed and applied to all bony metallic mating surfaces except for the posterior condyles of the femur itself. In order, we  hammered into place the tibial tray and removed excess cement, the femoral component and removed excess cement, a 42mm  RP bearing  was inserted, and the knee brought to full extension with compression.  The patellar button was clamped into place, and excess cement  removed. While the cement cured the wound was irrigated out with normal saline solution pulse lavage.. Ligament stability and patellar tracking were checked and found to be excellent.. The parapatellar arthrotomy was closed with  #1 Vicryl suture. The subcutaneous tissue with 0 and 2-0 undyed  Vicryl suture, and 4-0 Monocryl.. A dressing of Aquaseal,  4 x 4, dressing sponges, Webril, and Ace wrap applied. Needle and sponge count were correct times 2.The patient awakened, extubated, and taken to recovery room without difficulty. Vascular status was normal,  pulses 2+ and symmetric.   Joy Reiger A 07/28/2016, 9:01 AM

## 2016-07-28 NOTE — Anesthesia Postprocedure Evaluation (Signed)
Anesthesia Post Note  Patient: Dustin Jacobson  Procedure(s) Performed: Procedure(s) (LRB): TOTAL KNEE ARTHROPLASTY (Left)  Patient location during evaluation: PACU Anesthesia Type: Spinal and Regional Level of consciousness: awake and alert Pain management: pain level controlled Vital Signs Assessment: post-procedure vital signs reviewed and stable Respiratory status: spontaneous breathing and respiratory function stable Cardiovascular status: blood pressure returned to baseline and stable Postop Assessment: no headache, no backache and spinal receding Anesthetic complications: no       Last Vitals:  Vitals:   07/28/16 1323 07/28/16 1356  BP: (!) 145/87 134/75  Pulse: 87 84  Resp: 18   Temp: 36.8 C 37.1 C    Last Pain:  Vitals:   07/28/16 1356  TempSrc: Oral  PainSc:                  [name]

## 2016-07-28 NOTE — Anesthesia Procedure Notes (Signed)
Date/Time: 07/28/2016 7:14 AM Performed by: Barrington Ellison Pre-anesthesia Checklist: Patient identified, Emergency Drugs available, Suction available and Patient being monitored Oxygen Delivery Method: Simple face mask

## 2016-07-29 ENCOUNTER — Encounter (HOSPITAL_COMMUNITY): Payer: Self-pay | Admitting: Orthopedic Surgery

## 2016-07-29 LAB — BASIC METABOLIC PANEL
ANION GAP: 7 (ref 5–15)
BUN: 17 mg/dL (ref 6–20)
CO2: 25 mmol/L (ref 22–32)
Calcium: 9 mg/dL (ref 8.9–10.3)
Chloride: 107 mmol/L (ref 101–111)
Creatinine, Ser: 1 mg/dL (ref 0.61–1.24)
GFR calc non Af Amer: >60 mL/min (ref >60)
Glucose, Bld: 162 mg/dL — ABNORMAL HIGH (ref 65–99)
POTASSIUM: 4.4 mmol/L (ref 3.5–5.1)
Sodium: 139 mmol/L (ref 135–145)

## 2016-07-29 LAB — CBC
HEMATOCRIT: 36.5 % — AB (ref 39.0–52.0)
HEMOGLOBIN: 12.1 g/dL — AB (ref 13.0–17.0)
MCH: 29.5 pg (ref 26.0–34.0)
MCHC: 33.2 g/dL (ref 30.0–36.0)
MCV: 89 fL (ref 78.0–100.0)
Platelets: 185 10*3/uL (ref 150–400)
RBC: 4.1 MIL/uL — AB (ref 4.22–5.81)
RDW: 13.5 % (ref 11.5–15.5)
WBC: 14.5 10*3/uL — AB (ref 4.0–10.5)

## 2016-07-29 MED ORDER — DOCUSATE SODIUM 100 MG PO CAPS: ORAL_CAPSULE | ORAL | 0 refills | Status: DC

## 2016-07-29 MED ORDER — ACETAMINOPHEN 325 MG PO TABS: 650.0000 mg | ORAL_TABLET | Freq: Four times a day (QID) | ORAL | Status: DC | PRN

## 2016-07-29 MED ORDER — ASPIRIN 325 MG PO TBEC: DELAYED_RELEASE_TABLET | ORAL | 0 refills | Status: DC

## 2016-07-29 MED ORDER — SODIUM CHLORIDE 0.9 % IV BOLUS (SEPSIS)
500.0000 mL | Freq: Once | INTRAVENOUS | Status: AC
  Administered 2016-07-29: 500 mL via INTRAVENOUS

## 2016-07-29 MED ORDER — OXYCODONE HCL 5 MG PO TABS: ORAL_TABLET | ORAL | 0 refills | Status: DC

## 2016-07-29 MED ORDER — SODIUM CHLORIDE 0.9 % IV BOLUS (SEPSIS): 500.0000 mL | Freq: Once | INTRAVENOUS | Status: DC

## 2016-07-29 MED ORDER — PANTOPRAZOLE SODIUM 40 MG PO TBEC
40.0000 mg | DELAYED_RELEASE_TABLET | Freq: Two times a day (BID) | ORAL | Status: DC
  Administered 2016-07-29 – 2016-07-30 (×3): 40 mg via ORAL
  Filled 2016-07-29 (×3): qty 1

## 2016-07-29 MED ORDER — POLYETHYLENE GLYCOL 3350 17 G PO PACK: PACK | ORAL | 0 refills | Status: DC

## 2016-07-29 NOTE — Progress Notes (Signed)
Subjective: 1 Day Post-Op Procedure(s) (LRB): TOTAL KNEE ARTHROPLASTY (Left) Patient reports pain as 5 on 0-10 scale.    Objective: Vital signs in last 24 hours: Temp:  [97.9 F (36.6 C)-99.1 F (37.3 C)] 97.9 F (36.6 C) (03/06 0409) Pulse Rate:  [59-94] 75 (03/06 0409) Resp:  [13-20] 18 (03/06 0409) BP: (116-147)/(72-87) 128/78 (03/06 0409) SpO2:  [95 %-100 %] 96 % (03/06 0409)  Intake/Output from previous day: 03/05 0701 - 03/06 0700 In: 2220 [P.O.:720; I.V.:1500] Out: 4670 [Urine:4650; Blood:20] Intake/Output this shift: No intake/output data recorded.   Recent Labs  07/29/16 0550  HGB 12.1*    Recent Labs  07/29/16 0550  WBC 14.5*  RBC 4.10*  HCT 36.5*  PLT 185    Recent Labs  07/29/16 0550  NA 139  K 4.4  CL 107  CO2 25  BUN 17  CREATININE 1.00  GLUCOSE 162*  CALCIUM 9.0   No results for input(s): LABPT, INR in the last 72 hours.  Neurologically intact ABD soft Neurovascular intact Sensation intact distally Intact pulses distally Incision: dressing C/D/I patient tired easily with walking with me this am  Assessment/Plan: 1 Day Post-Op Procedure(s) (LRB): TOTAL KNEE ARTHROPLASTY (Left)  Principal Problem:   Primary localized osteoarthritis of left knee Active Problems:   GERD (gastroesophageal reflux disease)   Adenomatous colon polyp   Dysphagia   Schatzki's ring  Advance diet Up with therapy Plan for discharge tomorrow Discharge home with home health  Patient would like to go home today but he easily fatigued walking with me this am.  May need overnight stay to build stamina and stabilize  Anysha Frappier J 07/29/2016, 8:45 AM

## 2016-07-29 NOTE — Plan of Care (Signed)
Problem: Activity: Goal: Will remain free from falls Outcome: Progressing Safety precautions maintained, no fall or injury noted this shift  Problem: Physical Regulation: Goal: Postoperative complications will be avoided or minimized Outcome: Progressing No complications noted  Problem: Pain Management: Goal: Pain level will decrease with appropriate interventions Outcome: Progressing Medicated once for for pain with full relief

## 2016-07-29 NOTE — Progress Notes (Signed)
Physical Therapy Treatment Patient Details Name: Dustin Jacobson MRN: [id number] DOB: 01-21-1946 Today's Date: 07/29/2016    History of Present Illness Admitted for LTKA, [name];  has a past medical history of Adenomatous colon polyp (2002); Early cataract;  has a past surgical history that includes Knee surgery; Finger surgery; Bunionectomy    PT Comments    Patient continues to progress toward mobility goals. Pt able to negotiate stairs with min guard assist for safety. Continue to progress as tolerated with anticipated d/c home with HHPT.   Follow Up Recommendations  Home health PT;Supervision/Assistance - 24 hour     Equipment Recommendations  Rolling walker with 5" wheels;3in1 (PT)    Recommendations for Other Services       Precautions / Restrictions Precautions Precautions: Knee Precaution Booklet Issued: Yes (comment) Precaution Comments:  Required Braces or Orthoses: Knee Immobilizer - Left Knee Immobilizer - Left: On when out of bed or walking Restrictions Weight Bearing Restrictions: Yes LLE Weight Bearing: Weight bearing as tolerated    Mobility  Bed Mobility               General bed mobility comments: in chair on arrival  Transfers Overall transfer level: Needs assistance Equipment used: Rolling walker (2 wheeled) Transfers: Sit to/from Stand Sit to Stand: Supervision         General transfer comment: safe hand placement  Ambulation/Gait Ambulation/Gait assistance: Supervision Ambulation Distance (Feet): 225 Feet Assistive device: Rolling walker (2 wheeled) Gait Pattern/deviations: Step-through pattern;Decreased weight shift to left Gait velocity: decreased   General Gait Details: pt with improved WB L LE and step length symmetry this session; cues for posture   Stairs Stairs: Yes   Stair Management: One rail Left;Sideways;Step to pattern Number of Stairs:  (7 steps X2) General stair comments: cues for sequencing and technique; no knee  instability noted; min guard for safety  Wheelchair Mobility    Modified Rankin (Stroke Patients Only)       Balance                                    Cognition Arousal/Alertness: Awake/alert Behavior During Therapy: WFL for tasks assessed/performed Overall Cognitive Status: Within Functional Limits for tasks assessed                      Exercises      General Comments        Pertinent Vitals/Pain Pain Assessment: Faces Faces Pain Scale: Hurts little more Pain Location: L knee and thigh with mobility Pain Descriptors / Indicators: Aching;Sore Pain Intervention(s): Limited activity within patient's tolerance;Monitored during session;Repositioned;Premedicated before session    [place] expects to be discharged to:: Private residence Living Arrangements: Spouse/significant other Available Help at Discharge: Family;Available PRN/intermittently Type of Home: House Home Access: Stairs to enter Entrance Stairs-Rails: None Home Layout: Two level;Able to live on main level with bedroom/bathroom Home Equipment: Gilford Rile - 2 wheels;Shower seat - built in      Prior Function Level of Independence: Independent          PT Goals (current goals can now be found in the care plan section) Acute Rehab PT Goals Patient Stated Goal: back to hiking, riding horses, chasing 71 yo granschild PT Goal Formulation: With patient Time For Goal Achievement: 08/04/16 Potential to Achieve Goals: Good Progress towards PT goals: Progressing toward goals    Frequency    7X/week  PT Plan Current plan remains appropriate    Co-evaluation             End of Session Equipment Utilized During Treatment: Gait belt Activity Tolerance: Patient tolerated treatment well Patient left: with call bell/phone within reach;in chair;with family/visitor present;Other (comment) (L LE in zero degree foam) Nurse Communication: Mobility status PT Visit  Diagnosis: Pain Pain - Right/Left: Left Pain - part of body: Knee     Time: [id number] PT Time Calculation (min) (ACUTE ONLY): 20 min  Charges:  $Gait Training: 8-22 mins                    G Codes:       Salina April, PTA Pager: [phone]   07/29/2016, 3:33 PM

## 2016-07-29 NOTE — Care Management Note (Signed)
Case Management Note  Patient Details  Name: ETHANIEL STUVER MRN: [id number] Date of Birth: [birth year]  Subjective/Objective:     71 yr old gentleman s/p left total knee arthroplasty.               Action/Plan: Patient was preoperatively setup with [facility], no changes. DME has been delivered to patient's home. He will have support at discharge.   Expected Discharge Date:  07/29/16               Expected Discharge Plan:  [place]  In-House Referral:  NA  Discharge planning Services  CM Consult  Post Acute Care Choice:  Durable Medical Equipment, Home Health Choice offered to:  Patient  DME Arranged:  3-N-1, CPM, Walker rolling DME Agency:  [employer]  HH Arranged:  PT [place]:  [facility] [employer]  Status of Service:  Completed, signed off  If discussed at [employer] [facility] [employer], dates discussed:    Additional Comments:  Ninfa Meeker, RN 07/29/2016, 2:44 PM

## 2016-07-29 NOTE — Progress Notes (Signed)
Physical Therapy Treatment Patient Details Name: Dustin Jacobson MRN: [id number] DOB: 1946/03/26 Today's Date: 07/29/2016    History of Present Illness Admitted for LTKA, Dustin Jacobson;  has a past medical history of Adenomatous colon polyp (2002); Early cataract;  has a past surgical history that includes Knee surgery; Finger surgery; Bunionectomy    PT Comments    Patient is progressing well toward mobility goals and with ROM. Continue HEP and practice stairs next session. Current plan remains appropriate.    Follow Up Recommendations  Home health PT;Supervision/Assistance - 24 hour     Equipment Recommendations  Rolling walker with 5" wheels;3in1 (PT)    Recommendations for Other Services       Precautions / Restrictions Precautions Precautions: Knee Precaution Booklet Issued: Yes (comment) Restrictions Weight Bearing Restrictions: Yes LLE Weight Bearing: Weight bearing as tolerated    Mobility  Bed Mobility Overal bed mobility: Modified Independent Bed Mobility: Supine to Sit;Sit to Supine           General bed mobility comments: increaesed time/effort  Transfers Overall transfer level: Needs assistance Equipment used: Rolling walker (2 wheeled) Transfers: Sit to/from Stand Sit to Stand: Supervision         General transfer comment: safe hand placement  Ambulation/Gait Ambulation/Gait assistance: Supervision Ambulation Distance (Feet): 150 Feet Assistive device: Rolling walker (2 wheeled) Gait Pattern/deviations: Step-through pattern;Decreased weight shift to left Gait velocity: decreased   General Gait Details: cues for posture and engagement of L quad during stance phase   Stairs            Wheelchair Mobility    Modified Rankin (Stroke Patients Only)       Balance                                    Cognition Arousal/Alertness: Awake/alert Behavior During Therapy: WFL for tasks assessed/performed Overall Cognitive Status:  Within Functional Limits for tasks assessed                      Exercises Total Joint Exercises Quad Sets: AROM;Left;10 reps Heel Slides: AROM;Left;10 reps Straight Leg Raises: AROM;Left;10 reps Goniometric ROM: 5-82    General Comments        Pertinent Vitals/Pain Pain Assessment: Faces Faces Pain Scale: Hurts little more Pain Location: L knee and thigh with mobility Pain Descriptors / Indicators: Aching;Sore Pain Intervention(s): Monitored during session;Premedicated before session;Repositioned    Home Living                      Prior Function            PT Goals (current goals can now be found in the care plan section) Acute Rehab PT Goals Patient Stated Goal: back to hiking, riding horses, chasing 71 yo granschild PT Goal Formulation: With patient Time For Goal Achievement: 08/04/16 Potential to Achieve Goals: Good Progress towards PT goals: Progressing toward goals    Frequency    7X/week      PT Plan Current plan remains appropriate    Co-evaluation             End of Session Equipment Utilized During Treatment: Gait belt Activity Tolerance: Patient tolerated treatment well Patient left: with call bell/phone within reach;in bed;in CPM Nurse Communication: Mobility status PT Visit Diagnosis: Pain Pain - Right/Left: Left Pain - part of body: Knee     Time: [id number]  PT Time Calculation (min) (ACUTE ONLY): 32 min  Charges:  $Gait Training: 8-22 mins $Therapeutic Exercise: 8-22 mins                    G Codes:       Dustin Jacobson, PTA Pager: [phone]   07/29/2016, 11:26 AM

## 2016-07-29 NOTE — Evaluation (Signed)
Occupational Therapy Evaluation Patient Details Name: Dustin Jacobson MRN: [id number] DOB: 1946/03/11 Today's Date: 07/29/2016    History of Present Illness Admitted for LTKA, [name];  has a past medical history of Adenomatous colon polyp (2002); Early cataract;  has a past surgical history that includes Knee surgery; Finger surgery; Bunionectomy   Clinical Impression   Patient evaluated by Occupational Therapy with no further acute OT needs identified. All education has been completed and the patient has no further questions. See below for any follow-up Occupational Therapy or equipment needs. OT to sign off. Thank you for referral.     Follow Up Recommendations  No OT follow up    Equipment Recommendations  None recommended by OT    Recommendations for Other Services       Precautions / Restrictions Precautions Precautions: Knee Precaution Booklet Issued: Yes (comment) Precaution Comments:  Required Braces or Orthoses: Knee Immobilizer - Left Knee Immobilizer - Left: On when out of bed or walking Restrictions Weight Bearing Restrictions: Yes LLE Weight Bearing: Weight bearing as tolerated      Mobility Bed Mobility Overal bed mobility: Modified Independent Bed Mobility: Supine to Sit;Sit to Supine           General bed mobility comments: in chair on arrival  Transfers Overall transfer level: Needs assistance Equipment used: Rolling walker (2 wheeled) Transfers: Sit to/from Stand Sit to Stand: Supervision         General transfer comment: safe hand placement    Balance                                            ADL Overall ADL's : Modified independent                                       General ADL Comments: pt able to reach feed sitting in chair and currenlty fully dressed. Educated to dress L LE first. pt educated and demonstrated shower transfer x2 with wife present. pt educated on medication management and  positioning.  Pt educated on bathing and avoid washing directly on incision. Pt educated to use new wash cloth and towel each day. Pt educated to allow water to run across dressing and not to soak in a tub at this time. Pt advised RN will instruct on any bandages required otherwise is open to air.        Vision Baseline Vision/History: No visual deficits       Perception     Praxis      Pertinent Vitals/Pain Pain Assessment: Faces Faces Pain Scale: Hurts little more Pain Location: L knee and thigh with mobility Pain Descriptors / Indicators: Aching;Sore Pain Intervention(s): Limited activity within patient's tolerance;Monitored during session;Repositioned;Premedicated before session     Hand Dominance Right   Extremity/Trunk Assessment Upper Extremity Assessment Upper Extremity Assessment: Overall WFL for tasks assessed   Lower Extremity Assessment Lower Extremity Assessment: Defer to PT evaluation   Cervical / Trunk Assessment Cervical / Trunk Assessment: Normal   Communication Communication Communication: No difficulties   Cognition Arousal/Alertness: Awake/alert Behavior During Therapy: WFL for tasks assessed/performed Overall Cognitive Status: Within Functional Limits for tasks assessed                     General Comments  Exercises Exercises: Total Joint     Shoulder Instructions      Home Living Family/patient expects to be discharged to:: Private residence Living Arrangements: Spouse/significant other Available Help at Discharge: Family;Available PRN/intermittently Type of Home: House Home Access: Stairs to enter [employer] of Steps: 3 Entrance Stairs-Rails: None Home Layout: Two level;Able to live on main level with bedroom/bathroom     Bathroom Shower/Tub: Walk-in shower   Bathroom Toilet: Handicapped height     Home Equipment: Environmental consultant - 2 wheels;Shower seat - built in          Prior Functioning/Environment Level  of Independence: Independent                 OT Problem List:        OT Treatment/Interventions:      OT Goals(Current goals can be found in the care plan section) Acute Rehab OT Goals Patient Stated Goal: back to hiking, riding horses, chasing 70 yo granschild  OT Frequency:     Barriers to D/C:            Co-evaluation              End of Session Equipment Utilized During Treatment: Rolling walker  Activity Tolerance: Patient tolerated treatment well Patient left: in chair;with call bell/phone within reach;with family/visitor present  OT Visit Diagnosis: Unsteadiness on feet (R26.81)                ADL either performed or assessed with clinical judgement  Time: [id number] OT Time Calculation (min): 13 min Charges:  OT General Charges $OT Visit: 1 Procedure OT Evaluation $OT Eval Moderate Complexity: 1 Procedure G-Codes:      Jeri Modena   OTR/L PagerIP:[phone] Office: [phone] .   Parke Poisson B 07/29/2016, 3:09 PM

## 2016-07-30 LAB — CBC
HEMATOCRIT: 34.6 % — AB (ref 39.0–52.0)
HEMOGLOBIN: 11.2 g/dL — AB (ref 13.0–17.0)
MCH: 28.8 pg (ref 26.0–34.0)
MCHC: 32.4 g/dL (ref 30.0–36.0)
MCV: 88.9 fL (ref 78.0–100.0)
Platelets: 197 10*3/uL (ref 150–400)
RBC: 3.89 MIL/uL — AB (ref 4.22–5.81)
RDW: 14 % (ref 11.5–15.5)
WBC: 14.3 10*3/uL — ABNORMAL HIGH (ref 4.0–10.5)

## 2016-07-30 LAB — BASIC METABOLIC PANEL
Anion gap: 9 (ref 5–15)
BUN: 21 mg/dL — ABNORMAL HIGH (ref 6–20)
CHLORIDE: 105 mmol/L (ref 101–111)
CO2: 24 mmol/L (ref 22–32)
Calcium: 8.6 mg/dL — ABNORMAL LOW (ref 8.9–10.3)
Creatinine, Ser: 0.94 mg/dL (ref 0.61–1.24)
GFR calc non Af Amer: >60 mL/min (ref >60)
Glucose, Bld: 82 mg/dL (ref 65–99)
POTASSIUM: 4.1 mmol/L (ref 3.5–5.1)
SODIUM: 138 mmol/L (ref 135–145)

## 2016-07-30 MED ORDER — PANTOPRAZOLE SODIUM 40 MG PO TBEC: 40.0000 mg | DELAYED_RELEASE_TABLET | Freq: Two times a day (BID) | ORAL | 0 refills | Status: DC

## 2016-07-30 NOTE — Discharge Summary (Signed)
Patient ID: Dustin Jacobson MRN: [id number] DOB/AGE: [birth year] 71 y.o.  Admit date: 07/28/2016 Discharge date: 07/30/2016  Admission Diagnoses:  Principal Problem:   Primary localized osteoarthritis of left knee Active Problems:   GERD (gastroesophageal reflux disease)   Adenomatous colon polyp   Dysphagia   Schatzki's ring   Discharge Diagnoses:  Same  Past Medical History:  Diagnosis Date  . Adenomatous colon polyp 2002  . Early cataract    left  . GERD (gastroesophageal reflux disease)   . History of kidney stones   . Primary localized osteoarthritis of left knee   . Primary localized osteoarthritis of left knee   . S/P colonoscopy 05/15/2006   Dr Laurine Blazer anal canal hemorrhoids, otherwise normal  . Schatzki's ring 05/15/2006   Last EGD Dr Rourk-> dilated 64F, small hiatal hernia    Surgeries: Procedure(s): TOTAL KNEE ARTHROPLASTY on 07/28/2016   Consultants:   Discharged Condition: Improved  Hospital Course: Dustin Jacobson is an 71 y.o. male who was admitted 07/28/2016 for operative treatment ofPrimary localized osteoarthritis of left knee. Patient has severe unremitting pain that affects sleep, daily activities, and work/hobbies. After pre-op clearance the patient was taken to the operating room on 07/28/2016 and underwent  Procedure(s): TOTAL KNEE ARTHROPLASTY.    Patient was given perioperative antibiotics: Anti-infectives    Start     Dose/Rate Route Frequency Ordered Stop   07/28/16 0945  ceFAZolin (ANCEF) IVPB 2g/100 mL premix     2 g 200 mL/hr over 30 Minutes Intravenous Every 6 hours 07/28/16 0938 07/28/16 1812   07/28/16 0610  ceFAZolin (ANCEF) 2-4 GM/100ML-% IVPB    Comments:  Rosenberger, Meredit: cabinet override      07/28/16 0610 07/28/16 0723   07/28/16 2376  ceFAZolin (ANCEF) IVPB 2g/100 mL premix     2 g 200 mL/hr over 30 Minutes Intravenous On call to O.R. 07/28/16 2831 07/28/16 0753       Patient was given sequential compression devices,  early ambulation, and chemoprophylaxis to prevent DVT.  Patient benefited maximally from hospital stay and there were no complications.    Recent vital signs: Patient Vitals for the past 24 hrs:  BP Temp Temp src Pulse Resp SpO2  07/30/16 0415 128/64 98.4 F (36.9 C) Oral (!) 59 18 96 %  07/29/16 2257 135/70 98.1 F (36.7 C) Oral 72 18 96 %  07/29/16 1619 (!) 116/57 98.7 F (37.1 C) Oral 77 18 96 %     Recent laboratory studies:  Recent Labs  07/29/16 0550  WBC 14.5*  HGB 12.1*  HCT 36.5*  PLT 185  NA 139  K 4.4  CL 107  CO2 25  BUN 17  CREATININE 1.00  GLUCOSE 162*  CALCIUM 9.0     Discharge Medications:   Allergies as of 07/30/2016      Reactions   Relafen [nabumetone] Hives, Nausea And Vomiting   Dizziness      Medication List    STOP taking these medications   cephALEXin 500 MG capsule Commonly known as:  KEFLEX     TAKE these medications   acetaminophen 325 MG tablet Commonly known as:  TYLENOL Take 2 tablets (650 mg total) by mouth every 6 (six) hours as needed for mild pain (or Fever >/= 101).   aspirin 325 MG EC tablet 1 tab a day for the next 30 days to prevent blood clots   docusate sodium 100 MG capsule Commonly known as:  COLACE 1 tab 2 times a day  while on narcotics.  STOOL SOFTENER   meloxicam 15 MG tablet Commonly known as:  MOBIC Take 15 mg by mouth daily as needed for pain.   oxyCODONE 5 MG immediate release tablet Commonly known as:  Oxy IR/ROXICODONE 1-2 tablets every 4-6 hrs as needed for pain   pantoprazole 40 MG tablet Commonly known as:  PROTONIX Take 1 tablet (40 mg total) by mouth 2 (two) times daily.   polyethylene glycol packet Commonly known as:  MIRALAX / GLYCOLAX 17grams in 6 oz of water twice a day until bowel movement.  LAXITIVE.  Restart if two days since last bowel movement       Diagnostic Studies: No results found.  Disposition: 01-Home or Self Care  Discharge Instructions    CPM    Complete by:  As  directed    Continuous passive motion machine (CPM):      Use the CPM from 0 to 90 for 6 hours per day.       You may break it up into 2 or 3 sessions per day.      Use CPM for 2 weeks or until you are told to stop.   Call MD / Call 911    Complete by:  As directed    If you experience chest pain or shortness of breath, CALL 911 and be transported to the hospital emergency room.  If you develope a fever above 101 F, pus (white drainage) or increased drainage or redness at the wound, or calf pain, call your surgeon's office.   Change dressing    Complete by:  As directed    DO NOT REMOVE BANDAGE OVER SURGICAL INCISION.  [place] WHOLE LEG INCLUDING OVER THE WATERPROOF BANDAGE WITH SOAP AND WATER EVERY DAY.   Constipation Prevention    Complete by:  As directed    Drink plenty of fluids.  Prune juice may be helpful.  You may use a stool softener, such as Colace (over the counter) 100 mg twice a day.  Use MiraLax (over the counter) for constipation as needed.   Diet - low sodium heart healthy    Complete by:  As directed    Discharge instructions    Complete by:  As directed    INSTRUCTIONS AFTER JOINT REPLACEMENT   Remove items at home which could result in a fall. This includes throw rugs or furniture in walking pathways ICE to the affected joint every three hours while awake for 30 minutes at a time, for at least the first 3-5 days, and then as needed for pain and swelling.  Continue to use ice for pain and swelling. You may notice swelling that will progress down to the foot and ankle.  This is normal after surgery.  Elevate your leg when you are not up walking on it.   Continue to use the breathing machine you got in the hospital (incentive spirometer) which will help keep your temperature down.  It is common for your temperature to cycle up and down following surgery, especially at night when you are not up moving around and exerting yourself.  The breathing machine keeps your lungs expanded  and your temperature down.   DIET:  As you were doing prior to hospitalization, we recommend a well-balanced diet.  DRESSING / WOUND CARE / SHOWERING  Keep the surgical dressing until follow up.  The dressing is water proof, so you can shower without any extra covering.  IF THE DRESSING FALLS OFF or the wound gets  wet inside, change the dressing with sterile gauze.  Please use good hand washing techniques before changing the dressing.  Do not use any lotions or creams on the incision until instructed by your surgeon.    ACTIVITY  Increase activity slowly as tolerated, but follow the weight bearing instructions below.   No driving for 6 weeks or until further direction given by your physician.  You cannot drive while taking narcotics.  No lifting or carrying greater than 10 lbs. until further directed by your surgeon. Avoid periods of inactivity such as sitting longer than an hour when not asleep. This helps prevent blood clots.  You may return to work once you are authorized by your doctor.     WEIGHT BEARING   Weight bearing as tolerated with assist device (walker, cane, etc) as directed, use it as long as suggested by your surgeon or therapist, typically at least 2-3 weeks.   EXERCISES  Results after joint replacement surgery are often greatly improved when you follow the exercise, range of motion and muscle strengthening exercises prescribed by your doctor. Safety measures are also important to protect the joint from further injury. Any time any of these exercises cause you to have increased pain or swelling, decrease what you are doing until you are comfortable again and then slowly increase them. If you have problems or questions, call your caregiver or physical therapist for advice.   Rehabilitation is important following a joint replacement. After just a few days of immobilization, the muscles of the leg can become weakened and shrink (atrophy).  These exercises are designed to  build up the tone and strength of the thigh and leg muscles and to improve motion. Often times heat used for twenty to thirty minutes before working out will loosen up your tissues and help with improving the range of motion but do not use heat for the first two weeks following surgery (sometimes heat can increase post-operative swelling).   These exercises can be done on a training (exercise) mat, on the floor, on a table or on a bed. Use whatever works the best and is most comfortable for you.    Use music or television while you are exercising so that the exercises are a pleasant break in your day. This will make your life better with the exercises acting as a break in your routine that you can look forward to.   Perform all exercises about fifteen times, three times per day or as directed.  You should exercise both the operative leg and the other leg as well.   Exercises include:  Quad Sets - Tighten up the muscle on the front of the thigh (Quad) and hold for 5-10 seconds.   Straight Leg Raises - With your knee straight (if you were given a brace, keep it on), lift the leg to 60 degrees, hold for 3 seconds, and slowly lower the leg.  Perform this exercise against resistance later as your leg gets stronger.  Leg Slides: Lying on your back, slowly slide your foot toward your buttocks, bending your knee up off the floor (only go as far as is comfortable). Then slowly slide your foot back down until your leg is flat on the floor again.  Angel Wings: Lying on your back spread your legs to the side as far apart as you can without causing discomfort.  Hamstring Strength:  Lying on your back, push your heel against the floor with your leg straight by tightening up the muscles of  your buttocks.  Repeat, but this time bend your knee to a comfortable angle, and push your heel against the floor.  You may put a pillow under the heel to make it more comfortable if necessary.   A rehabilitation program following  joint replacement surgery can speed recovery and prevent re-injury in the future due to weakened muscles. Contact your doctor or a physical therapist for more information on knee rehabilitation.    CONSTIPATION  Constipation is defined medically as fewer than three stools per week and severe constipation as less than one stool per week.  Even if you have a regular bowel pattern at home, your normal regimen is likely to be disrupted due to multiple reasons following surgery.  Combination of anesthesia, postoperative narcotics, change in appetite and fluid intake all can affect your bowels.   YOU MUST use at least one of the following options; they are listed in order of increasing strength to get the job done.  They are all available over the counter, and you may need to use some, POSSIBLY even all of these options:    Drink plenty of fluids (prune juice may be helpful) and high fiber foods Colace 100 mg by mouth twice a day  Senokot for constipation as directed and as needed Dulcolax (bisacodyl), take with full glass of water  Miralax (polyethylene glycol) once or twice a day as needed.  If you have tried all these things and are unable to have a bowel movement in the first 3-4 days after surgery call either your surgeon or your primary doctor.    If you experience loose stools or diarrhea, hold the medications until you stool forms back up.  If your symptoms do not get better within 1 week or if they get worse, check with your doctor.  If you experience "the worst abdominal pain ever" or develop nausea or vomiting, please contact the office immediately for further recommendations for treatment.   ITCHING:  If you experience itching with your medications, try taking only a single pain pill, or even half a pain pill at a time.  You can also use Benadryl over the counter for itching or also to help with sleep.   TED HOSE STOCKINGS:  Use stockings on both legs until for at least 2 weeks or as  directed by physician office. They may be removed at night for sleeping.  MEDICATIONS:  See your medication summary on the "After Visit Summary" that nursing will review with you.  You may have some home medications which will be placed on hold until you complete the course of blood thinner medication.  It is important for you to complete the blood thinner medication as prescribed.  PRECAUTIONS:  If you experience chest pain or shortness of breath - call 911 immediately for transfer to the hospital emergency department.   If you develop a fever greater that 101 F, purulent drainage from wound, increased redness or drainage from wound, foul odor from the wound/dressing, or calf pain - CONTACT YOUR SURGEON.                                                   FOLLOW-UP APPOINTMENTS:  If you do not already have a post-op appointment, please call the office for an appointment to be seen by your surgeon.  Guidelines for how soon to  be seen are listed in your "After Visit Summary", but are typically between 1-4 weeks after surgery.  OTHER INSTRUCTIONS:   Knee Replacement:  Do not place pillow under knee, focus on keeping the knee straight while resting. CPM instructions: 0-90 degrees, 2 hours in the morning, 2 hours in the afternoon, and 2 hours in the evening. Place foam block, curve side up under heel at all times except when in CPM or when walking.  DO NOT modify, tear, cut, or change the foam block in any way.  MAKE SURE YOU:  Understand these instructions.  Get help right away if you are not doing well or get worse.    Thank you for letting us be a part of your medical care team.  It is a privilege we respect greatly.  We hope these instructions will help you stay on track for a fast and full recovery!   Do not put a pillow under the knee. Place it under the heel.    Complete by:  As directed    Place gray foam block, curve side up under heel at all times except when in CPM or when walking.  DO NOT  modify, tear, cut, or change in any way the gray foam block.   Increase activity slowly as tolerated    Complete by:  As directed    Patient may shower    Complete by:  As directed    Aquacel dressing is water proof    Wash over it and the whole leg with soap and water at the end of your shower   TED hose    Complete by:  As directed    Use stockings (TED hose) for 2 weeks on both leg(s).  You may remove them at night for sleeping.      Follow-up Information    [facility] Follow up.   Specialty:  [place] Why:  A representative from [facility] will contact you to arrange start date and time for therapy. Contact information: [address] [place] [address] [phone]            Signed: Linda Hedges 07/30/2016, 7:26 AM

## 2016-07-30 NOTE — Progress Notes (Signed)
Physical Therapy Treatment Patient Details Name: Dustin Jacobson MRN: [id number] DOB: Oct 02, 1945 Today's Date: 07/30/2016    History of Present Illness Admitted for LTKA, [name];  has a past medical history of Adenomatous colon polyp (2002); Early cataract;  has a past surgical history that includes Knee surgery; Finger surgery; Bunionectomy    PT Comments    Patient is making good progress with PT.  From a mobility standpoint anticipate patient will be ready for DC home when medically ready.      Follow Up Recommendations  Home health PT;Supervision/Assistance - 24 hour     Equipment Recommendations  Rolling walker with 5" wheels;3in1 (PT)    Recommendations for Other Services       Precautions / Restrictions Precautions Precautions: Knee Precaution Booklet Issued: Yes (comment) Restrictions Weight Bearing Restrictions: Yes LLE Weight Bearing: Weight bearing as tolerated    Mobility  Bed Mobility Overal bed mobility: Independent                Transfers Overall transfer level: Modified independent Equipment used: Rolling walker (2 wheeled) Transfers: Sit to/from Stand              Ambulation/Gait Ambulation/Gait assistance: Supervision Ambulation Distance (Feet): 250 Feet Assistive device: Rolling walker (2 wheeled) Gait Pattern/deviations: Step-through pattern;Decreased weight shift to left Gait velocity: decreased   General Gait Details: pt with improved gait mechanics and WB as well as improved L knee extension in stance phase   Stairs     Stair Management: One rail Left;Sideways;Step to pattern Number of Stairs: 10 General stair comments: pt with carry over of sequencing and technique  Wheelchair Mobility    Modified Rankin (Stroke Patients Only)       Balance                                    Cognition Arousal/Alertness: Awake/alert Behavior During Therapy: WFL for tasks assessed/performed Overall Cognitive Status:  Within Functional Limits for tasks assessed                      Exercises Total Joint Exercises Quad Sets: AROM;Left;10 reps Short Arc Quad: AROM;Left;10 reps Heel Slides: AROM;Left;10 reps Hip ABduction/ADduction: AROM;Left;10 reps Straight Leg Raises: AROM;Left;10 reps Long Arc Quad: AROM;Left;10 reps Knee Flexion: AROM;Left;5 reps;Seated;Other (comment) (10 sec holds) Goniometric ROM: 3-88    General Comments General comments (skin integrity, edema, etc.): wife present during session      Pertinent Vitals/Pain Pain Assessment: Faces Faces Pain Scale: Hurts little more Pain Location: L knee  Pain Descriptors / Indicators: Sore Pain Intervention(s): Monitored during session;Premedicated before session;Repositioned    Home Living                      Prior Function            PT Goals (current goals can now be found in the care plan section) Acute Rehab PT Goals Patient Stated Goal: back to hiking, riding horses, chasing 71 yo granschild PT Goal Formulation: With patient Time For Goal Achievement: 08/04/16 Potential to Achieve Goals: Good Progress towards PT goals: Progressing toward goals    Frequency    7X/week      PT Plan Current plan remains appropriate    Co-evaluation             End of Session Equipment Utilized During Treatment: Gait belt Activity Tolerance:  Patient tolerated treatment well Patient left: with call bell/phone within reach;with family/visitor present;in bed (sitting EOB with wife) Nurse Communication: Mobility status PT Visit Diagnosis: Pain Pain - Right/Left: Left Pain - part of body: Knee     Time: 7342-8768 PT Time Calculation (min) (ACUTE ONLY): 35 min  Charges:  $Gait Training: 8-22 mins $Therapeutic Exercise: 8-22 mins                    G Codes:       Salina April, PTA Pager: [phone]   07/30/2016, 9:22 AM

## 2016-08-14 ENCOUNTER — Other Ambulatory Visit: Payer: Self-pay | Admitting: Orthopedic Surgery

## 2016-08-14 DIAGNOSIS — M7121 Synovial cyst of popliteal space [Baker], right knee: Secondary | ICD-10-CM

## 2016-08-21 ENCOUNTER — Ambulatory Visit
Admission: RE | Admit: 2016-08-21 | Discharge: 2016-08-21 | Disposition: A | Discharge status: Home / Self Care | Payer: Medicare Other | Source: Ambulatory Visit | Attending: Orthopedic Surgery | Admitting: Orthopedic Surgery

## 2016-08-21 DIAGNOSIS — M7121 Synovial cyst of popliteal space [Baker], right knee: Secondary | ICD-10-CM

## 2017-01-28 ENCOUNTER — Other Ambulatory Visit: Payer: Self-pay

## 2017-01-28 ENCOUNTER — Encounter: Payer: Self-pay | Admitting: Gastroenterology

## 2017-01-28 ENCOUNTER — Ambulatory Visit (INDEPENDENT_AMBULATORY_CARE_PROVIDER_SITE_OTHER): Payer: Medicare Other | Admitting: Gastroenterology

## 2017-01-28 VITALS — BP 140/70 | HR 81 | Temp 97.6°F | Ht 70.0 in | Wt 180.0 lb

## 2017-01-28 DIAGNOSIS — A048 Other specified bacterial intestinal infections: Secondary | ICD-10-CM | POA: Diagnosis not present

## 2017-01-28 DIAGNOSIS — R1310 Dysphagia, unspecified: Secondary | ICD-10-CM

## 2017-01-28 DIAGNOSIS — D126 Benign neoplasm of colon, unspecified: Secondary | ICD-10-CM | POA: Diagnosis not present

## 2017-01-28 MED ORDER — PEG 3350-KCL-NA BICARB-NACL 420 G PO SOLR: 4000.0000 mL | ORAL | 0 refills | Status: DC

## 2017-01-28 NOTE — Patient Instructions (Addendum)
We have scheduled you for a colonoscopy and upper endoscopy with dilation in the near future!  Good to meet you! Further recommendations to follow.

## 2017-01-28 NOTE — Assessment & Plan Note (Addendum)
Last EGD in 2012 with Schatzki's ring s/p dilation. Also found to have H.pylori gastritis at that time and prescribed Prevpac. Recurrent dysphagia noted and attempting to control via dietary behaviors. Currently not on a PPI, stating he has no reflux symptoms. Will arrange EGD with dilation as appropriate in near future. Discussed starting PPI therapy pending results of upcoming EGD.  Proceed with upper endoscopy/dilation in the near future with Dr. Gala Romney. The risks, benefits, and alternatives have been discussed in detail with patient. They have stated understanding and desire to proceed.  Urea breath test now to document eradication (he has been off PPIs and would not need to hold anything prior to test)

## 2017-01-28 NOTE — Progress Notes (Signed)
Primary Care Physician:  Redmond School, MD Primary Gastroenterologist:  Dr. Gala Romney   Chief Complaint  Patient presents with  . Hemorrhoids  . Dysphagia    HPI:   Dustin Jacobson is a 71 y.o. male presenting today with history of adenomas and due for surveillance colonoscopy. Last colonoscopy 2012. Last EGD in 2012 as well with erosive reflux esophagitis, Schatzki's ring s/p dilation, H.pylori gastritis. Treated with Prevpac. Need to document eradication.   Has had slowly recurring dysphagia but "dealing with it". Chicken difficult. Stopped taking Protonix over 3 years ago. Meloxicam every other day. States he doesn't have heartburn like he "did at one time". Trying to control through diet and eating properly. A few months ago had isolated incidence of rectal bleeding. No issues with hemorrhoids currently. Used some preparation H OTC, which helped. No abdominal pain.     Past Medical History:  Diagnosis Date  . Adenomatous colon polyp 2002  . Early cataract    left  . GERD (gastroesophageal reflux disease)   . History of kidney stones   . Primary localized osteoarthritis of left knee   . Primary localized osteoarthritis of left knee   . S/P colonoscopy 05/15/2006   Dr Laurine Blazer anal canal hemorrhoids, otherwise normal  . Schatzki's ring 05/15/2006   Last EGD Dr Rourk-> dilated 79F, small hiatal hernia    Past Surgical History:  Procedure Laterality Date  . BUNIONECTOMY    . COLONOSCOPY  04/28/2011   Dr. Gala Romney: colonic diverticulosis, single diminutive polyp (tubular adenoma), 5 year surveillance  . ESOPHAGOGASTRODUODENOSCOPY  2012   Dr. Gala Romney: erosive reflux esophagitis, Schatzki's ring s/p dilation, H.pylori gastritis  . FINGER SURGERY    . INGUINAL HERNIA REPAIR     bilat  . KNEE SURGERY     bilat  . TOTAL KNEE ARTHROPLASTY Left 07/28/2016   Procedure: TOTAL KNEE ARTHROPLASTY;  Surgeon: Elsie Saas, MD;  Location: [facility];  Service: Orthopedics;  Laterality:  Left;  . UMBILICAL HERNIA REPAIR      Current Outpatient Prescriptions  Medication Sig Dispense Refill  . acetaminophen (TYLENOL) 325 MG tablet Take 2 tablets (650 mg total) by mouth every 6 (six) hours as needed for mild pain (or Fever >/= 101).    . meloxicam (MOBIC) 15 MG tablet Take 15 mg by mouth daily as needed for pain.     No current facility-administered medications for this visit.     Allergies as of 01/28/2017 - Review Complete 01/28/2017  Allergen Reaction Noted  . Relafen [nabumetone] Hives and Nausea And Vomiting 07/05/2014    Family History  Problem Relation Age of Onset  . Heart failure Mother   . Colon polyps Mother   . Coronary artery disease Father   . Lung cancer Brother   . Anesthesia problems Neg Hx   . Hypotension Neg Hx   . Malignant hyperthermia Neg Hx   . Pseudochol deficiency Neg Hx   . Colon cancer Neg Hx     Social History   Social History  . Marital status: Married    Spouse name: N/A  . Number of children: 2  . Years of education: N/A   Occupational History  . retired; Pharmacist, hospital    Social History Main Topics  . Smoking status: Never Smoker  . Smokeless tobacco: Never Used     Comment: quit about 40 yrs ago  . Alcohol use Yes     Comment: rare  . Drug use: No  .  Sexual activity: Yes    Birth control/ protection: None   Other Topics Concern  . Not on file   Social History Narrative   Lives w/ wife          Review of Systems: Gen: Denies any fever, chills, fatigue, weight loss, lack of appetite.  CV: Denies chest pain, heart palpitations, peripheral edema, syncope.  Resp: Denies shortness of breath at rest or with exertion. Denies wheezing or cough.  GI:see HPI  GU : Denies urinary burning, urinary frequency, urinary hesitancy MS: Denies joint pain, muscle weakness, cramps, or limitation of movement.  Derm: Denies rash, itching, dry skin Psych: Denies depression, anxiety, memory loss, and confusion Heme: Denies bruising,  bleeding, and enlarged lymph nodes.  Physical Exam: BP 140/70   Pulse 81   Temp 97.6 F (36.4 C) (Oral)   Ht 5\' 10"  (1.778 m)   Wt 180 lb (81.6 kg)   BMI 25.83 kg/m  General:   Alert and oriented. Pleasant and cooperative. Well-nourished and well-developed. Appears much younger than stated age.  Head:  Normocephalic and atraumatic. Eyes:  Without icterus, sclera clear and conjunctiva pink.  Ears:  Normal auditory acuity. Nose:  No deformity, discharge,  or lesions. Mouth:  No deformity or lesions Lungs:  Clear to auscultation bilaterally. No wheezes, rales, or rhonchi. No distress.  Heart:  S1, S2 present without murmurs appreciated.  Abdomen:  +BS, soft, non-tender and non-distended. No HSM noted. No guarding or rebound. No masses appreciated.  Rectal:  Deferred  Msk:  Symmetrical without gross deformities. Normal posture. Extremities:  Without  edema. Neurologic:  Alert and  oriented x4;  grossly normal neurologically. Psych:  Alert and cooperative. Normal mood and affect.

## 2017-01-28 NOTE — Patient Instructions (Signed)
PA info for TCS/EGD/+/-DIL submitted via [facility] website. No PA needed. Decision ID# [id number].

## 2017-01-28 NOTE — Assessment & Plan Note (Signed)
71 year old male with history of colon polyps in 2002, normal colonoscopy 2007, last colonoscopy 2012 with adenoma. Due for surveillance now. Isolated low-volume hematochezia several months ago in setting of what he feels was a hemorrhoid exacerbation. No persistent rectal bleeding or other alarm features.   Proceed with TCS with Dr. Gala Romney in near future: the risks, benefits, and alternatives have been discussed with the patient in detail. The patient states understanding and desires to proceed.

## 2017-01-29 NOTE — Progress Notes (Signed)
cc'ed to pcp °

## 2017-02-11 LAB — H. PYLORI BREATH TEST: H. pylori Breath Test: NOT DETECTED

## 2017-02-12 NOTE — Progress Notes (Signed)
Urea breath test negative for H.pylori. He was eradicated.

## 2017-02-17 ENCOUNTER — Telehealth: Payer: Self-pay

## 2017-02-17 NOTE — Telephone Encounter (Signed)
Pt is aware that his time has changed from 1:45 pm to 9:15 am. He is aware of the new time and knows to drink his prep at 4:00 am and nothing after 6:00 am

## 2017-02-18 ENCOUNTER — Encounter (HOSPITAL_COMMUNITY)
Admission: RE | Disposition: A | Discharge status: Home / Self Care | Payer: Self-pay | Source: Ambulatory Visit | Attending: Internal Medicine

## 2017-02-18 ENCOUNTER — Ambulatory Visit (HOSPITAL_COMMUNITY)
Admission: RE | Admit: 2017-02-18 | Discharge: 2017-02-18 | Disposition: A | Discharge status: Home / Self Care | Payer: Medicare Other | Source: Ambulatory Visit | Attending: Internal Medicine | Admitting: Internal Medicine

## 2017-02-18 ENCOUNTER — Encounter (HOSPITAL_COMMUNITY): Payer: Self-pay | Admitting: *Deleted

## 2017-02-18 DIAGNOSIS — Z96652 Presence of left artificial knee joint: Secondary | ICD-10-CM | POA: Insufficient documentation

## 2017-02-18 DIAGNOSIS — K5730 Diverticulosis of large intestine without perforation or abscess without bleeding: Secondary | ICD-10-CM | POA: Diagnosis not present

## 2017-02-18 DIAGNOSIS — Z801 Family history of malignant neoplasm of trachea, bronchus and lung: Secondary | ICD-10-CM | POA: Insufficient documentation

## 2017-02-18 DIAGNOSIS — Z86010 Personal history of colonic polyps: Secondary | ICD-10-CM | POA: Diagnosis not present

## 2017-02-18 DIAGNOSIS — R1310 Dysphagia, unspecified: Secondary | ICD-10-CM | POA: Insufficient documentation

## 2017-02-18 DIAGNOSIS — Z8371 Family history of colonic polyps: Secondary | ICD-10-CM | POA: Insufficient documentation

## 2017-02-18 DIAGNOSIS — Z8249 Family history of ischemic heart disease and other diseases of the circulatory system: Secondary | ICD-10-CM | POA: Insufficient documentation

## 2017-02-18 DIAGNOSIS — K209 Esophagitis, unspecified: Secondary | ICD-10-CM

## 2017-02-18 DIAGNOSIS — M1712 Unilateral primary osteoarthritis, left knee: Secondary | ICD-10-CM | POA: Diagnosis not present

## 2017-02-18 DIAGNOSIS — Z87891 Personal history of nicotine dependence: Secondary | ICD-10-CM | POA: Diagnosis not present

## 2017-02-18 DIAGNOSIS — K210 Gastro-esophageal reflux disease with esophagitis: Secondary | ICD-10-CM | POA: Diagnosis not present

## 2017-02-18 DIAGNOSIS — K222 Esophageal obstruction: Secondary | ICD-10-CM | POA: Diagnosis not present

## 2017-02-18 DIAGNOSIS — Z1211 Encounter for screening for malignant neoplasm of colon: Secondary | ICD-10-CM | POA: Diagnosis not present

## 2017-02-18 DIAGNOSIS — K449 Diaphragmatic hernia without obstruction or gangrene: Secondary | ICD-10-CM | POA: Insufficient documentation

## 2017-02-18 DIAGNOSIS — D126 Benign neoplasm of colon, unspecified: Secondary | ICD-10-CM

## 2017-02-18 HISTORY — PX: ESOPHAGOGASTRODUODENOSCOPY: SHX5428

## 2017-02-18 HISTORY — PX: MALONEY DILATION: SHX5535

## 2017-02-18 HISTORY — PX: COLONOSCOPY: SHX5424

## 2017-02-18 SURGERY — COLONOSCOPY
Anesthesia: Moderate Sedation

## 2017-02-18 MED ORDER — ONDANSETRON HCL 4 MG/2ML IJ SOLN
INTRAMUSCULAR | Status: DC | PRN
  Administered 2017-02-18: 4 mg via INTRAVENOUS

## 2017-02-18 MED ORDER — MEPERIDINE HCL 100 MG/ML IJ SOLN
INTRAMUSCULAR | Status: DC | PRN
  Administered 2017-02-18: 25 mg via INTRAVENOUS
  Administered 2017-02-18: 50 mg via INTRAVENOUS
  Administered 2017-02-18: 25 mg via INTRAVENOUS

## 2017-02-18 MED ORDER — MIDAZOLAM HCL 5 MG/5ML IJ SOLN
INTRAMUSCULAR | Status: DC | PRN
  Administered 2017-02-18: 1 mg via INTRAVENOUS
  Administered 2017-02-18: 2 mg via INTRAVENOUS
  Administered 2017-02-18: 1 mg via INTRAVENOUS
  Administered 2017-02-18: 2 mg via INTRAVENOUS

## 2017-02-18 MED ORDER — SODIUM CHLORIDE 0.9 % IV SOLN: INTRAVENOUS | Status: DC

## 2017-02-18 MED ORDER — ONDANSETRON HCL 4 MG/2ML IJ SOLN
INTRAMUSCULAR | Status: AC
  Filled 2017-02-18: qty 2

## 2017-02-18 MED ORDER — LIDOCAINE VISCOUS 2 % MT SOLN
OROMUCOSAL | Status: DC
Start: 2017-02-18 — End: 2017-02-18
  Filled 2017-02-18: qty 15

## 2017-02-18 MED ORDER — LIDOCAINE VISCOUS 2 % MT SOLN
OROMUCOSAL | Status: DC | PRN
  Administered 2017-02-18: 1 via OROMUCOSAL

## 2017-02-18 MED ORDER — MEPERIDINE HCL 100 MG/ML IJ SOLN
INTRAMUSCULAR | Status: AC
  Filled 2017-02-18: qty 2

## 2017-02-18 MED ORDER — MIDAZOLAM HCL 5 MG/5ML IJ SOLN
INTRAMUSCULAR | Status: AC
  Filled 2017-02-18: qty 10

## 2017-02-18 NOTE — Op Note (Signed)
[facility] Patient Name: Dustin Jacobson Procedure Date: 02/18/2017 9:23 AM MRN: [id number] Date of Birth: [birth year] Attending MD: Norvel Richards , MD CSN: [id number] Age: 71 Admit Type: Outpatient Procedure:                Upper GI endoscopy Indications:              Dysphagia Providers:                Norvel Richards, MD, Lurline Del, RN, Randa Spike, Technician Referring MD:              Medicines:                Midazolam 5 mg IV, Meperidine 100 mg IV,                            Ondansetron 4 mg IV Complications:            No immediate complications. Estimated Blood Loss:     Estimated blood loss was minimal. Procedure:                Pre-Anesthesia Assessment:                           - Prior to the procedure, a History and Physical                            was performed, and patient medications and                            allergies were reviewed. The patient's tolerance of                            previous anesthesia was also reviewed. The risks                            and benefits of the procedure and the sedation                            options and risks were discussed with the patient.                            All questions were answered, and informed consent                            was obtained. Prior Anticoagulants: The patient has                            taken no previous anticoagulant or antiplatelet                            agents. ASA Grade Assessment: II - A patient with  mild systemic disease. After reviewing the risks                            and benefits, the patient was deemed in                            satisfactory condition to undergo the procedure.                           After obtaining informed consent, the endoscope was                            passed under direct vision. Throughout the                            procedure, the patient's blood pressure,  pulse, and                            oxygen saturations were monitored continuously. The                            [id number] ([id number]) scope was introduced through the                            mouth, and advanced to the second part of duodenum.                            The upper GI endoscopy was accomplished without                            difficulty. The patient tolerated the procedure                            well. Scope In: 10:11:26 AM Scope Out: 10:19:28 AM Total Procedure Duration: 0 hours 8 minutes 2 seconds  Findings:      One moderate stenosis was found. Schatzki's ring superimposed on a       component of a stricture. Initially, dilated with scope passage. .      Distal esophageal erosions present. No Barrett's epithelium or tumor       seen.      A small hiatal hernia was present.      The exam was otherwise without abnormality.      The duodenal bulb and second portion of the duodenum were normal. The       scope was withdrawn. Dilation was performed with a Maloney dilator with       no resistance at 52 Fr. The scope was withdrawn. Dilation was performed       with a Maloney dilator with mild resistance at 10 Fr. The dilation site       was examined following endoscope reinsertion and showed moderate       improvement in luminal narrowing. Estimated blood loss was minimal Impression:               - Peptic stricture/ring. Dilated.                           -  Reflux Esophagitis.                           - Small hiatal hernia.                           - The examination was otherwise normal.                           - Normal duodenal bulb and second portion of the                            duodenum.                           - No specimens collected. Moderate Sedation:      Moderate (conscious) sedation was administered by the endoscopy nurse       and supervised by the endoscopist. The following parameters were       monitored: oxygen saturation, heart rate,  blood pressure, respiratory       rate, EKG, adequacy of pulmonary ventilation, and response to care.       Total physician intraservice time was 19 minutes. Recommendation:           - Patient has a contact number available for                            emergencies.                           - Return to normal activities tomorrow.                           - Resume previous diet. Begin Protonix 40 mg daily.                            See colonoscopy report.                           - Continue present medications.                           - No repeat upper endoscopy.                           - Return to GI office in 1 year. Procedure Code(s):        --- Professional ---                           [id number], Moderate sedation services provided by the                            same physician or other qualified health care                            professional performing the diagnostic or  therapeutic service that the sedation supports,                            requiring the presence of an independent trained                            observer to assist in the monitoring of the                            patient's level of consciousness and physiological                            status; initial 15 minutes of intraservice time,                            patient age 59 years or older Diagnosis Code(s):        --- Professional ---                           K22.2, Esophageal obstruction                           K20.9, Esophagitis, unspecified                           K44.9, Diaphragmatic hernia without obstruction or                            gangrene                           R13.10, Dysphagia, unspecified CPT copyright 2016 American Medical Association. All rights reserved. The codes documented in this report are preliminary and upon coder review may  be revised to meet current compliance requirements. Cristopher Estimable. Benino Korinek, MD Norvel Richards,  MD 02/18/2017 10:43:30 AM This report has been signed electronically. Number of Addenda: 0

## 2017-02-18 NOTE — Op Note (Signed)
[facility] Patient Name: Dustin Jacobson Procedure Date: 02/18/2017 10:22 AM MRN: [id number] Date of Birth: 1945-06-05 Attending MD: Norvel Richards , MD CSN: [id number] Age: 71 Admit Type: Outpatient Procedure:                Colonoscopy Indications:              High risk colon cancer surveillance: Personal                            history of colonic polyps Providers:                Norvel Richards, MD, Lurline Del, RN, Randa Spike, Technician Referring MD:              Medicines:                Midazolam 6 mg IV, Meperidine 100 mg IV,                            Ondansetron 4 mg IV, Indomethacin 650 mg PR Complications:            No immediate complications. Estimated Blood Loss:     Estimated blood loss: none. Procedure:                Pre-Anesthesia Assessment:                           - Prior to the procedure, a History and Physical                            was performed, and patient medications and                            allergies were reviewed. The patient's tolerance of                            previous anesthesia was also reviewed. The risks                            and benefits of the procedure and the sedation                            options and risks were discussed with the patient.                            All questions were answered, and informed consent                            was obtained. Prior Anticoagulants: The patient has                            taken no previous anticoagulant or antiplatelet  agents. ASA Grade Assessment: II - A patient with                            mild systemic disease. After reviewing the risks                            and benefits, the patient was deemed in                            satisfactory condition to undergo the procedure.                           After obtaining informed consent, the colonoscope                            was passed  under direct vision. Throughout the                            procedure, the patient's blood pressure, pulse, and                            oxygen saturations were monitored continuously. The                            EC-[id number] ([id number]) scope was introduced through                            the anus and advanced to the the cecum, identified                            by appendiceal orifice and ileocecal valve. The                            ileocecal valve, appendiceal orifice, and rectum                            were photographed. The ileocecal valve, appendiceal                            orifice, and rectum were photographed. The entire                            colon was well visualized. The colonoscopy was                            performed without difficulty. The patient tolerated                            the procedure well. The quality of the bowel                            preparation was adequate. Scope In: 10:25:06 AM Scope Out: 10:38:39 AM Scope Withdrawal Time: 0 hours 8 minutes 47 seconds  Total Procedure Duration:  0 hours 13 minutes 33 seconds  Findings:      The perianal and digital rectal examinations were normal.      Scattered small and large-mouthed diverticula were found in the sigmoid       colon and descending colon.      The exam was otherwise without abnormality on direct and retroflexion       views. Impression:               - Diverticulosis in the sigmoid colon and in the                            descending colon.                           - The examination was otherwise normal on direct                            and retroflexion views.                           - No specimens collected. Moderate Sedation:      Moderate (conscious) sedation was administered by the endoscopy nurse       and supervised by the endoscopist. The following parameters were       monitored: oxygen saturation, heart rate, blood pressure, respiratory       rate, EKG,  adequacy of pulmonary ventilation, and response to care.       Total physician intraservice time was 38 minutes. Recommendation:           - Patient has a contact number available for                            emergencies. The signs and symptoms of potential                            delayed complications were discussed with the                            patient. Return to normal activities tomorrow.                            Written discharge instructions were provided to the                            patient.                           - Continue present medications.                           - Await pathology results.                           - No repeat colonoscopy due to age.                           - Return to GI clinic in 1 year.                           -  Patient has a contact number available for                            emergencies. The signs and symptoms of potential                            delayed complications were discussed with the                            patient. Return to normal activities tomorrow.                            Written discharge instructions were provided to the                            patient.                           - Resume previous diet. Procedure Code(s):        --- Professional ---                           [phone], Colonoscopy, flexible; diagnostic, including                            collection of specimen(s) by brushing or washing,                            when performed (separate procedure)                           99152, Moderate sedation services provided by the                            same physician or other qualified health care                            professional performing the diagnostic or                            therapeutic service that the sedation supports,                            requiring the presence of an independent trained                            observer to assist in the monitoring of the                             patient's level of consciousness and physiological                            status; initial 15 minutes of intraservice time,  patient age 72 years or older                           [phone], Moderate sedation services; each additional                            15 minutes intraservice time                           [phone], Moderate sedation services; each additional                            15 minutes intraservice time Diagnosis Code(s):        --- Professional ---                           Z86.010, Personal history of colonic polyps                           K57.30, Diverticulosis of large intestine without                            perforation or abscess without bleeding CPT copyright 2016 American Medical Association. All rights reserved. The codes documented in this report are preliminary and upon coder review may  be revised to meet current compliance requirements. Cristopher Estimable. Rourk, MD Norvel Richards, MD 02/18/2017 11:51:53 AM This report has been signed electronically. Number of Addenda: 0

## 2017-02-18 NOTE — Discharge Instructions (Addendum)
Diverticulosis Diverticulosis is a condition that develops when small pouches (diverticula) form in the wall of the large intestine (colon). The colon is where water is absorbed and stool is formed. The pouches form when the inside layer of the colon pushes through weak spots in the outer layers of the colon. You may have a few pouches or many of them. What are the causes? The cause of this condition is not known. What increases the risk? The following factors may make you more likely to develop this condition:  Being older than age 27. Your risk for this condition increases with age. Diverticulosis is rare among people younger than age 36. By age 28, many people have it.  Eating a low-fiber diet.  Having frequent constipation.  Being overweight.  Not getting enough exercise.  Smoking.  Taking over-the-counter pain medicines, like aspirin and ibuprofen.  Having a family history of diverticulosis.  What are the signs or symptoms? In most people, there are no symptoms of this condition. If you do have symptoms, they may include:  Bloating.  Cramps in the abdomen.  Constipation or diarrhea.  Pain in the lower left side of the abdomen.  How is this diagnosed? This condition is most often diagnosed during an exam for other colon problems. Because diverticulosis usually has no symptoms, it often cannot be diagnosed independently. This condition may be diagnosed by:  Using a flexible scope to examine the colon (colonoscopy).  Taking an X-ray of the colon after dye has been put into the colon (barium enema).  Doing a CT scan.  How is this treated? You may not need treatment for this condition if you have never developed an infection related to diverticulosis. If you have had an infection before, treatment may include:  Eating a high-fiber diet. This may include eating more fruits, vegetables, and grains.  Taking a fiber supplement.  Taking a live bacteria supplement  (probiotic).  Taking medicine to relax your colon.  Taking antibiotic medicines.  Follow these instructions at home:  Drink 6-8 glasses of water or more each day to prevent constipation.  Try not to strain when you have a bowel movement.  If you have had an infection before: ? Eat more fiber as directed by your health care provider or your diet and nutrition specialist (dietitian). ? Take a fiber supplement or probiotic, if your health care provider approves.  Take over-the-counter and prescription medicines only as told by your health care provider.  If you were prescribed an antibiotic, take it as told by your health care provider. Do not stop taking the antibiotic even if you start to feel better.  Keep all follow-up visits as told by your health care provider. This is important. Contact a health care provider if:  You have pain in your abdomen.  You have bloating.  You have cramps.  You have not had a bowel movement in 3 days. Get help right away if:  Your pain gets worse.  Your bloating becomes very bad.  You have a fever or chills, and your symptoms suddenly get worse.  You vomit.  You have bowel movements that are bloody or black.  You have bleeding from your rectum. Summary  Diverticulosis is a condition that develops when small pouches (diverticula) form in the wall of the large intestine (colon).  You may have a few pouches or many of them.  This condition is most often diagnosed during an exam for other colon problems.  If you have had an  infection related to diverticulosis, treatment may include increasing the fiber in your diet, taking supplements, or taking medicines. This information is not intended to replace advice given to you by your health care provider. Make sure you discuss any questions you have with your health care provider. Document Released: 02/07/2004 Document Revised: 03/31/2016 Document Reviewed: 03/31/2016 Elsevier Interactive  Patient Education  2017 [place]. Colonoscopy, Adult, Care After This sheet gives you information about how to care for yourself after your procedure. Your doctor may also give you more specific instructions. If you have problems or questions, call your doctor. Follow these instructions at home: General instructions   For the first 24 hours after the procedure: ? Do not drive or use machinery. ? Do not sign important documents. ? Do not drink alcohol. ? Do your daily activities more slowly than normal. ? Eat foods that are soft and easy to digest. ? Rest often.  Take over-the-counter or prescription medicines only as told by your doctor.  It is up to you to get the results of your procedure. Ask your doctor, or the department performing the procedure, when your results will be ready. To help cramping and bloating:  Try walking around.  Put heat on your belly (abdomen) as told by your doctor. Use a heat source that your doctor recommends, such as a moist heat pack or a heating pad. ? Put a towel between your skin and the heat source. ? Leave the heat on for 20-30 minutes. ? Remove the heat if your skin turns bright red. This is especially important if you cannot feel pain, heat, or cold. You can get burned. Eating and drinking  Drink enough fluid to keep your pee (urine) clear or pale yellow.  Return to your normal diet as told by your doctor. Avoid heavy or fried foods that are hard to digest.  Avoid drinking alcohol for as long as told by your doctor. Contact a doctor if:  You have blood in your poop (stool) 2-3 days after the procedure. Get help right away if:  You have more than a small amount of blood in your poop.  You see large clumps of tissue (blood clots) in your poop.  Your belly is swollen.  You feel sick to your stomach (nauseous).  You throw up (vomit).  You have a fever.  You have belly pain that gets worse, and medicine does not help your  pain. This information is not intended to replace advice given to you by your health care provider. Make sure you discuss any questions you have with your health care provider. Document Released: 06/14/2010 Document Revised: 02/04/2016 Document Reviewed: 02/04/2016 Elsevier Interactive Patient Education  2017 [place].   Gastroesophageal Reflux Disease, Adult Normally, food travels down the esophagus and stays in the stomach to be digested. If a person has gastroesophageal reflux disease (GERD), food and stomach acid move back up into the esophagus. When this happens, the esophagus becomes sore and swollen (inflamed). Over time, GERD can make small holes (ulcers) in the lining of the esophagus. Follow these instructions at home: Diet  Follow a diet as told by your doctor. You may need to avoid foods and drinks such as: ? Coffee and tea (with or without caffeine). ? Drinks that contain alcohol. ? Energy drinks and sports drinks. ? Carbonated drinks or sodas. ? Chocolate and cocoa. ? Peppermint and mint flavorings. ? Garlic and onions. ? Horseradish. ? Spicy and acidic foods, such as peppers, chili powder, curry  powder, vinegar, hot sauces, and BBQ sauce. ? Citrus fruit juices and citrus fruits, such as oranges, lemons, and limes. ? Tomato-based foods, such as red sauce, chili, salsa, and pizza with red sauce. ? Fried and fatty foods, such as donuts, french fries, potato chips, and high-fat dressings. ? High-fat meats, such as hot dogs, rib eye steak, sausage, ham, and bacon. ? High-fat dairy items, such as whole milk, butter, and cream cheese.  Eat small meals often. Avoid eating large meals.  Avoid drinking large amounts of liquid with your meals.  Avoid eating meals during the 2-3 hours before bedtime.  Avoid lying down right after you eat.  Do not exercise right after you eat. General instructions  Pay attention to any changes in your symptoms.  Take over-the-counter  and prescription medicines only as told by your doctor. Do not take aspirin, ibuprofen, or other NSAIDs unless your doctor says it is okay.  Do not use any tobacco products, including cigarettes, chewing tobacco, and e-cigarettes. If you need help quitting, ask your doctor.  Wear loose clothes. Do not wear anything tight around your waist.  Raise (elevate) the head of your bed about 6 inches (15 cm).  Try to lower your stress. If you need help doing this, ask your doctor.  If you are overweight, lose an amount of weight that is healthy for you. Ask your doctor about a safe weight loss goal.  Keep all follow-up visits as told by your doctor. This is important. Contact a doctor if:  You have new symptoms.  You lose weight and you do not know why it is happening.  You have trouble swallowing, or it hurts to swallow.  You have wheezing or a cough that keeps happening.  Your symptoms do not get better with treatment.  You have a hoarse voice. Get help right away if:  You have pain in your arms, neck, jaw, teeth, or back.  You feel sweaty, dizzy, or light-headed.  You have chest pain or shortness of breath.  You throw up (vomit) and your throw up looks like blood or coffee grounds.  You pass out (faint).  Your poop (stool) is bloody or black.  You cannot swallow, drink, or eat. This information is not intended to replace advice given to you by your health care provider. Make sure you discuss any questions you have with your health care provider. Document Released: 10/29/2007 Document Revised: 10/18/2015 Document Reviewed: 09/06/2014 Elsevier Interactive Patient Education  2018 [employer].  Colonoscopy Discharge Instructions  Read the instructions outlined below and refer to this sheet in the next few weeks. These discharge instructions provide you with general information on caring for yourself after you leave the hospital. Your doctor may also give you specific  instructions. While your treatment has been planned according to the most current medical practices available, unavoidable complications occasionally occur. If you have any problems or questions after discharge, call Dr. Gala Romney at [phone]. ACTIVITY  You may resume your regular activity, but move at a slower pace for the next 24 hours.   Take frequent rest periods for the next 24 hours.   Walking will help get rid of the air and reduce the bloated feeling in your belly (abdomen).   No driving for 24 hours (because of the medicine (anesthesia) used during the test).    Do not sign any important legal documents or operate any machinery for 24 hours (because of the anesthesia used during the test).  NUTRITION  Drink  plenty of fluids.   You may resume your normal diet as instructed by your doctor.   Begin with a light meal and progress to your normal diet. Heavy or fried foods are harder to digest and may make you feel sick to your stomach (nauseated).   Avoid alcoholic beverages for 24 hours or as instructed.  MEDICATIONS  You may resume your normal medications unless your doctor tells you otherwise.  WHAT YOU CAN EXPECT TODAY  Some feelings of bloating in the abdomen.   Passage of more gas than usual.   Spotting of blood in your stool or on the toilet paper.  IF YOU HAD POLYPS REMOVED DURING THE COLONOSCOPY:  No aspirin products for 7 days or as instructed.   No alcohol for 7 days or as instructed.   Eat a soft diet for the next 24 hours.  FINDING OUT THE RESULTS OF YOUR TEST Not all test results are available during your visit. If your test results are not back during the visit, make an appointment with your caregiver to find out the results. Do not assume everything is normal if you have not heard from your caregiver or the medical facility. It is important for you to follow up on all of your test results.  SEEK IMMEDIATE MEDICAL ATTENTION IF:  You have more than a spotting  of blood in your stool.   Your belly is swollen (abdominal distention).   You are nauseated or vomiting.   You have a temperature over 101.   You have abdominal pain or discomfort that is severe or gets worse throughout the day.    Diverticulosis information provided  I do not recommend a future colonoscopy unless new symptoms develop  GERD information provided  Begin Protonix 40 mg daily  Office visit with [place] in one year.

## 2017-02-18 NOTE — Interval H&P Note (Signed)
History and Physical Interval Note:  02/18/2017 9:55 AM  Dustin Jacobson  has presented today for surgery, with the diagnosis of dysphagia, adenomas  The various methods of treatment have been discussed with the patient and family. After consideration of risks, benefits and other options for treatment, the patient has consented to  Procedure(s) with comments: COLONOSCOPY (N/A) - 1:45pm ESOPHAGOGASTRODUODENOSCOPY (EGD) (N/A) MALONEY DILATION (N/A) as a surgical intervention .  The patient's history has been reviewed, patient examined, no change in status, stable for surgery.  I have reviewed the patient's chart and labs.  Questions were answered to the patient's satisfaction.     Robert Rourk  No change. EGD with ED as feasible/appopriate as well as a colonoscopy per plan.  The risks, benefits, limitations, imponderables and alternatives regarding both EGD and colonoscopy have been reviewed with the patient. Questions have been answered. All parties agreeable.

## 2017-02-18 NOTE — H&P (View-Only) (Signed)
Urea breath test negative for H.pylori. He was eradicated.

## 2017-02-27 ENCOUNTER — Encounter (HOSPITAL_COMMUNITY): Payer: Self-pay | Admitting: Internal Medicine

## 2017-04-30 ENCOUNTER — Other Ambulatory Visit: Payer: Self-pay | Admitting: Orthopedic Surgery

## 2017-04-30 DIAGNOSIS — M7121 Synovial cyst of popliteal space [Baker], right knee: Secondary | ICD-10-CM

## 2017-05-07 ENCOUNTER — Ambulatory Visit
Admission: RE | Admit: 2017-05-07 | Discharge: 2017-05-07 | Disposition: A | Discharge status: Home / Self Care | Payer: Medicare Other | Source: Ambulatory Visit | Attending: Orthopedic Surgery | Admitting: Orthopedic Surgery

## 2017-05-07 ENCOUNTER — Other Ambulatory Visit: Payer: Self-pay | Admitting: Orthopedic Surgery

## 2017-05-07 DIAGNOSIS — M7121 Synovial cyst of popliteal space [Baker], right knee: Secondary | ICD-10-CM

## 2017-05-13 ENCOUNTER — Other Ambulatory Visit: Payer: Medicare Other

## 2017-07-16 ENCOUNTER — Ambulatory Visit (INDEPENDENT_AMBULATORY_CARE_PROVIDER_SITE_OTHER): Payer: Medicare Other | Admitting: Otolaryngology

## 2017-07-16 DIAGNOSIS — H9313 Tinnitus, bilateral: Secondary | ICD-10-CM | POA: Diagnosis not present

## 2017-07-16 DIAGNOSIS — H903 Sensorineural hearing loss, bilateral: Secondary | ICD-10-CM

## 2018-03-25 ENCOUNTER — Encounter: Payer: Self-pay | Admitting: Internal Medicine

## 2018-04-14 ENCOUNTER — Encounter: Payer: Self-pay | Admitting: Physician Assistant

## 2018-04-14 DIAGNOSIS — M1711 Unilateral primary osteoarthritis, right knee: Secondary | ICD-10-CM

## 2018-04-14 DIAGNOSIS — Z96652 Presence of left artificial knee joint: Secondary | ICD-10-CM

## 2018-04-14 HISTORY — DX: Unilateral primary osteoarthritis, right knee: M17.11

## 2018-04-14 HISTORY — DX: Presence of left artificial knee joint: Z96.652

## 2018-04-14 NOTE — H&P (Signed)
TOTAL KNEE ADMISSION H&P  Patient is being admitted for right total knee arthroplasty.  Subjective:  Chief Complaint:right knee pain.  HPI: Dustin Jacobson, 72 y.o. male, has a history of pain and functional disability in the right knee due to arthritis and has failed non-surgical conservative treatments for greater than 12 weeks to includeNSAID's and/or analgesics, corticosteriod injections, viscosupplementation injections, flexibility and strengthening excercises, supervised PT with diminished ADL's post treatment, use of assistive devices, weight reduction as appropriate and activity modification.  Onset of symptoms was gradual, starting 10 years ago with gradually worsening course since that time. The patient noted prior procedures on the knee to include  arthroscopy and menisectomy on the right knee(s).  Patient currently rates pain in the right knee(s) at 10 out of 10 with activity. Patient has night pain, worsening of pain with activity and weight bearing, pain that interferes with activities of daily living, crepitus and joint swelling.  Patient has evidence of subchondral sclerosis, periarticular osteophytes and joint space narrowing by imaging studies.  There is no active infection.  Patient Active Problem List   Diagnosis Date Noted  . Primary localized osteoarthritis of right knee 04/14/2018  . S/P TKR (total knee replacement), left 04/14/2018  . Primary localized osteoarthritis of left knee   . GERD (gastroesophageal reflux disease) 03/31/2011  . Adenomatous colon polyp 03/31/2011  . Dysphagia 03/31/2011  . Schatzki's ring 03/31/2011   Past Medical History:  Diagnosis Date  . Adenomatous colon polyp 2002  . Early cataract    left  . GERD (gastroesophageal reflux disease)   . History of kidney stones   . Primary localized osteoarthritis of left knee   . Primary localized osteoarthritis of left knee   . Primary localized osteoarthritis of right knee 04/14/2018  . S/P  colonoscopy 05/15/2006   Dr Laurine Blazer anal canal hemorrhoids, otherwise normal  . S/P TKR (total knee replacement), left 04/14/2018  . Schatzki's ring 05/15/2006   Last EGD Dr Rourk-> dilated 13F, small hiatal hernia    Past Surgical History:  Procedure Laterality Date  . BUNIONECTOMY    . COLONOSCOPY  04/28/2011   Dr. Gala Romney: colonic diverticulosis, single diminutive polyp (tubular adenoma), 5 year surveillance  . COLONOSCOPY N/A 02/18/2017   Procedure: COLONOSCOPY;  Surgeon: Daneil Dolin, MD;  Location: [facility];  Service: Endoscopy;  Laterality: N/A;  1:45pm  . ESOPHAGOGASTRODUODENOSCOPY  2012   Dr. Gala Romney: erosive reflux esophagitis, Schatzki's ring s/p dilation, H.pylori gastritis  . ESOPHAGOGASTRODUODENOSCOPY N/A 02/18/2017   Procedure: ESOPHAGOGASTRODUODENOSCOPY (EGD);  Surgeon: Daneil Dolin, MD;  Location: [facility];  Service: Endoscopy;  Laterality: N/A;  . FINGER SURGERY Left    middle finger  . INGUINAL HERNIA REPAIR     bilat  . KNEE SURGERY     bilat  . MALONEY DILATION N/A 02/18/2017   Procedure: Venia Minks DILATION;  Surgeon: Daneil Dolin, MD;  Location: [facility];  Service: Endoscopy;  Laterality: N/A;  . TOTAL KNEE ARTHROPLASTY Left 07/28/2016   Procedure: TOTAL KNEE ARTHROPLASTY;  Surgeon: Elsie Saas, MD;  Location: [facility];  Service: Orthopedics;  Laterality: Left;  . UMBILICAL HERNIA REPAIR      No current facility-administered medications for this encounter.    Current Outpatient Medications  Medication Sig Dispense Refill Last Dose  . meloxicam (MOBIC) 15 MG tablet Take 15 mg by mouth daily as needed for pain.    04/14/2018 at Unknown time  . Naphazoline HCl (CLEAR EYES OP) Place 1 drop  into both eyes daily as needed (for dry eyes).   Past Month at Unknown time   Allergies  Allergen Reactions  . Relafen [Nabumetone] Hives, Nausea Only and Other (See Comments)    Dizziness     Social History   Tobacco Use  . Smoking status: Never  Smoker  . Smokeless tobacco: Never Used  . Tobacco comment: quit about 40 yrs ago  Substance Use Topics  . Alcohol use: Yes    Comment: once every one to two months    Family History  Problem Relation Age of Onset  . Heart failure Mother   . Colon polyps Mother   . Coronary artery disease Father   . Lung cancer Brother   . Anesthesia problems Neg Hx   . Hypotension Neg Hx   . Malignant hyperthermia Neg Hx   . Pseudochol deficiency Neg Hx   . Colon cancer Neg Hx      Review of Systems  Constitutional: Negative.   HENT: Negative.   Eyes: Negative.   Respiratory: Negative.   Cardiovascular: Negative.   Gastrointestinal: Negative.   Genitourinary: Negative.   Musculoskeletal: Positive for back pain and joint pain.  Skin: Negative.   Neurological: Negative.   Endo/Heme/Allergies: Negative.   Psychiatric/Behavioral: Negative.     Objective:  Physical Exam  Constitutional: He is oriented to person, place, and time. He appears well-developed and well-nourished.  HENT:  Head: Normocephalic and atraumatic.  Mouth/Throat: Oropharynx is clear and moist.  Eyes: Pupils are equal, round, and reactive to light. Conjunctivae are normal.  Neck: Neck supple.  Cardiovascular: Normal rate, regular rhythm and intact distal pulses.  Respiratory: Effort normal and breath sounds normal.  GI: Soft. Bowel sounds are normal.  Genitourinary:  Genitourinary Comments: Not pertinent to current symptomatology therefore not examined.  Musculoskeletal:  Examination of his right knee reveals varus deformity.  1+ synovitis.  Mild pain.  Range of motion -5 to 125 degrees.  Knee is stable with normal patella tracking.  Examination of the left knee reveals well healed total knee prosthesis without swelling or pain.  Range of motion 0-130 degrees.  Knee is stable with normal patella tracking.  Vascular exam: Pulses are 2+ and symmetric.    Neurological: He is alert and oriented to person, place, and time.   Skin: Skin is warm and dry.  Psychiatric: He has a normal mood and affect. His behavior is normal.    Vital signs in last 24 hours: Temp:  [98.1 F (36.7 C)] 98.1 F (36.7 C) (11/20 1400) Pulse Rate:  [73] 73 (11/20 1400) BP: (130)/(82) 130/82 (11/20 1400) SpO2:  [97 %] 97 % (11/20 1400) Weight:  [85.2 kg] 85.2 kg (11/20 1400)  Labs:   Estimated body mass index is 28.55 kg/m as calculated from the following:   Height as of this encounter: 5\' 8"  (1.727 m).   Weight as of this encounter: 85.2 kg.   Imaging Review Plain radiographs demonstrate severe degenerative joint disease of the right knee(s). The overall alignment issignificant valgus. The bone quality appears to be good for age and reported activity level.   Preoperative templating of the joint replacement has been completed, documented, and submitted to the Operating Room personnel in order to optimize intra-operative equipment management.    Patient's anticipated LOS is less than 2 midnights, meeting these requirements: - Younger than 76 - Lives within 1 hour of care - Has a competent adult at home to recover with post-op recover - NO history of  -  Chronic pain requiring opiods  - Diabetes  - Coronary Artery Disease  - Heart failure  - Heart attack  - Stroke  - DVT/VTE  - Cardiac arrhythmia  - Respiratory Failure/COPD  - Renal failure  - Anemia  - Advanced Liver disease        Assessment/Plan:  End stage arthritis, right knee   The patient history, physical examination, clinical judgment of the provider and imaging studies are consistent with end stage degenerative joint disease of the right knee(s) and total knee arthroplasty is deemed medically necessary. The treatment options including medical management, injection therapy arthroscopy and arthroplasty were discussed at length. The risks and benefits of total knee arthroplasty were presented and reviewed. The risks due to aseptic loosening,  infection, stiffness, patella tracking problems, thromboembolic complications and other imponderables were discussed. The patient acknowledged the explanation, agreed to proceed with the plan and consent was signed. Patient is being admitted for inpatient treatment for surgery, pain control, PT, OT, prophylactic antibiotics, VTE prophylaxis, progressive ambulation and ADL's and discharge planning. The patient is planning to be discharged home with home health services

## 2018-04-15 NOTE — Pre-Procedure Instructions (Signed)
Basem Yannuzzi Kleier  04/15/2018      [facility] - [place], [place] [address] Phone: [phone] Fax: [phone]    Your procedure is scheduled on December 2nd.  Report to [facility] Admitting at [email or web address].  Call this number if you have problems the morning of surgery:  [phone]   Remember:  Do not eat or drink after midnight.    Take these medicines the morning of surgery with A SIP OF WATER   Naphazoline HCl (CLEAR EYES OP) if needed  7 days prior to surgery STOP taking any meloxicam (MOBIC), Aspirin(unless otherwise instructed by your surgeon), Aleve, Naproxen, Ibuprofen, Motrin, Advil, Goody's, BC's, all herbal medications, fish oil, and all vitamins     Do not wear jewelry.  Do not wear lotions, powders, or colognes, or deodorant.  Men may shave face and neck.  Do not bring valuables to the hospital.  [facility] is not responsible for any belongings or valuables.  Contacts, dentures or bridgework may not be worn into surgery.  Leave your suitcase in the car.  After surgery it may be brought to your room.  For patients admitted to the hospital, discharge time will be determined by your treatment team.  Patients discharged the day of surgery will not be allowed to drive home.    [place]- Preparing For Surgery  Before surgery, you can play an important role. Because skin is not sterile, your skin needs to be as free of germs as possible. You can reduce the number of germs on your skin by washing with CHG (chlorahexidine gluconate) Soap before surgery.  CHG is an antiseptic cleaner which kills germs and bonds with the skin to continue killing germs even after washing.    Oral Hygiene is also important to reduce your risk of infection.  Remember - BRUSH YOUR TEETH THE MORNING OF SURGERY WITH YOUR REGULAR TOOTHPASTE  Please do not use if you have an allergy to CHG or antibacterial soaps. If your skin  becomes reddened/irritated stop using the CHG.  Do not shave (including legs and underarms) for at least 48 hours prior to first CHG shower. It is OK to shave your face.  Please follow these instructions carefully.   1. Shower the NIGHT BEFORE SURGERY and the MORNING OF SURGERY with CHG.   2. If you chose to wash your hair, wash your hair first as usual with your normal shampoo.  3. After you shampoo, rinse your hair and body thoroughly to remove the shampoo.  4. Use CHG as you would any other liquid soap. You can apply CHG directly to the skin and wash gently with a scrungie or a clean washcloth.   5. Apply the CHG Soap to your body ONLY FROM THE NECK DOWN.  Do not use on open wounds or open sores. Avoid contact with your eyes, ears, mouth and genitals (private parts). Wash Face and genitals (private parts)  with your normal soap.  6. Wash thoroughly, paying special attention to the area where your surgery will be performed.  7. Thoroughly rinse your body with warm water from the neck down.  8. DO NOT shower/wash with your normal soap after using and rinsing off the CHG Soap.  9. Pat yourself dry with a CLEAN TOWEL.  10. Wear CLEAN PAJAMAS to bed the night before surgery, wear comfortable clothes the morning of surgery  11. Place CLEAN  SHEETS on your bed the night of your first shower and DO NOT SLEEP WITH PETS.    Day of Surgery:  Do not apply any deodorants/lotions.  Please wear clean clothes to the hospital/surgery center.   Remember to brush your teeth WITH YOUR REGULAR TOOTHPASTE.    Please read over the following fact sheets that you were given.

## 2018-04-16 ENCOUNTER — Encounter (HOSPITAL_COMMUNITY)
Admission: RE | Admit: 2018-04-16 | Discharge: 2018-04-16 | Disposition: A | Discharge status: Home / Self Care | Payer: Medicare Other | Source: Ambulatory Visit | Attending: Orthopedic Surgery | Admitting: Orthopedic Surgery

## 2018-04-16 ENCOUNTER — Other Ambulatory Visit: Payer: Self-pay

## 2018-04-16 ENCOUNTER — Encounter (HOSPITAL_COMMUNITY): Payer: Self-pay

## 2018-04-16 DIAGNOSIS — Z79899 Other long term (current) drug therapy: Secondary | ICD-10-CM | POA: Diagnosis not present

## 2018-04-16 DIAGNOSIS — Z87442 Personal history of urinary calculi: Secondary | ICD-10-CM | POA: Insufficient documentation

## 2018-04-16 DIAGNOSIS — Z01818 Encounter for other preprocedural examination: Secondary | ICD-10-CM | POA: Insufficient documentation

## 2018-04-16 DIAGNOSIS — M1711 Unilateral primary osteoarthritis, right knee: Secondary | ICD-10-CM | POA: Diagnosis not present

## 2018-04-16 DIAGNOSIS — F17210 Nicotine dependence, cigarettes, uncomplicated: Secondary | ICD-10-CM | POA: Diagnosis not present

## 2018-04-16 DIAGNOSIS — I252 Old myocardial infarction: Secondary | ICD-10-CM | POA: Insufficient documentation

## 2018-04-16 DIAGNOSIS — K219 Gastro-esophageal reflux disease without esophagitis: Secondary | ICD-10-CM | POA: Diagnosis not present

## 2018-04-16 LAB — COMPREHENSIVE METABOLIC PANEL
ALT: 17 U/L (ref 0–44)
ANION GAP: 7 (ref 5–15)
AST: 18 U/L (ref 15–41)
Albumin: 3.5 g/dL (ref 3.5–5.0)
Alkaline Phosphatase: 72 U/L (ref 38–126)
BILIRUBIN TOTAL: 0.6 mg/dL (ref 0.3–1.2)
BUN: 22 mg/dL (ref 8–23)
CHLORIDE: 109 mmol/L (ref 98–111)
CO2: 22 mmol/L (ref 22–32)
Calcium: 9 mg/dL (ref 8.9–10.3)
Creatinine, Ser: 1.19 mg/dL (ref 0.61–1.24)
GFR calc Af Amer: >60 mL/min (ref >60)
GFR calc non Af Amer: 60 mL/min — ABNORMAL LOW (ref >60)
GLUCOSE: 104 mg/dL — AB (ref 70–99)
POTASSIUM: 4.2 mmol/L (ref 3.5–5.1)
Sodium: 138 mmol/L (ref 135–145)
TOTAL PROTEIN: 7.2 g/dL (ref 6.5–8.1)

## 2018-04-16 LAB — CBC WITH DIFFERENTIAL/PLATELET
Abs Immature Granulocytes: 0.02 10*3/uL (ref 0.00–0.07)
BASOS ABS: 0.1 10*3/uL (ref 0.0–0.1)
BASOS PCT: 1 %
EOS ABS: 0.1 10*3/uL (ref 0.0–0.5)
EOS PCT: 1 %
HEMATOCRIT: 46.3 % (ref 39.0–52.0)
Hemoglobin: 14.9 g/dL (ref 13.0–17.0)
Immature Granulocytes: 0 %
Lymphocytes Relative: 27 %
Lymphs Abs: 1.9 10*3/uL (ref 0.7–4.0)
MCH: 30 pg (ref 26.0–34.0)
MCHC: 32.2 g/dL (ref 30.0–36.0)
MCV: 93.2 fL (ref 80.0–100.0)
Monocytes Absolute: 0.7 10*3/uL (ref 0.1–1.0)
Monocytes Relative: 10 %
NRBC: 0 % (ref 0.0–0.2)
Neutro Abs: 4.3 10*3/uL (ref 1.7–7.7)
Neutrophils Relative %: 61 %
PLATELETS: 188 10*3/uL (ref 150–400)
RBC: 4.97 MIL/uL (ref 4.22–5.81)
RDW: 13.5 % (ref 11.5–15.5)
WBC: 7 10*3/uL (ref 4.0–10.5)

## 2018-04-16 LAB — APTT: APTT: 31 s (ref 24–36)

## 2018-04-16 LAB — TYPE AND SCREEN
ABO/RH(D): O POS
ANTIBODY SCREEN: NEGATIVE

## 2018-04-16 LAB — SURGICAL PCR SCREEN
MRSA, PCR: NEGATIVE
Staphylococcus aureus: NEGATIVE

## 2018-04-16 LAB — PROTIME-INR
INR: 1.05
PROTHROMBIN TIME: 13.6 s (ref 11.4–15.2)

## 2018-04-16 NOTE — Progress Notes (Addendum)
Anesthesia Chart Review:  Case:  [id number] Date/Time:  04/26/18 0930   Procedure:  TOTAL KNEE ARTHROPLASTY (Right )   Anesthesia type:  Spinal   Pre-op diagnosis:  OA RIGHT KNEE   Location:  [place] OR ROOM 07 / [facility]   Surgeon:  Elsie Saas, MD      DISCUSSION: Patient is a 72 year old male scheduled for the above procedure. History includes smoking, GERD.   Urine culture pending.   UPDATE 04/19/18 10:01 AM: Urine culture showed no growth. EKG confirmed by interpreting cardiologist as SB with marked sinus arrhythmias, and she felt tracing was not significantly changed since last tracing. Based on available information, I would anticipate that he can proceed as planned.    VS: BP 132/79   Pulse 66   Temp 36.5 C   Resp 20   Ht 5\' 8"  (1.727 m)   Wt 82.1 kg   SpO2 99%   BMI 27.52 kg/m    PROVIDERS: Redmond School, MD is PCP ([place])   LABS: Labs reviewed: Acceptable for surgery. Urine culture in process. (all labs ordered are listed, but only abnormal results are displayed)  Labs Reviewed  COMPREHENSIVE METABOLIC PANEL - Abnormal; Notable for the following components:      Result Value   Glucose, Bld 104 (*)    GFR calc non Af Amer 60 (*)    All other components within normal limits  SURGICAL PCR SCREEN  URINE CULTURE  APTT  CBC WITH DIFFERENTIAL/PLATELET  PROTIME-INR  TYPE AND SCREEN    EKG: 04/16/18: SB at 59 bpm, marked sinus arrhythmia. Inferior infarct (age undetermined). Q wave in III. Low voltage with small Q wave in aVF. No Q wave in II. Q wave is deeper in III and QRS complex is smaller when when compared to 07/06/14 tracing.   CV: He reported a normal stress test > 15 years ago.   Past Medical History:  Diagnosis Date  . Adenomatous colon polyp 2002  . Early cataract    left  . GERD (gastroesophageal reflux disease)   . History of kidney stones   . Primary localized osteoarthritis of left knee   . Primary localized osteoarthritis  of left knee   . Primary localized osteoarthritis of right knee 04/14/2018  . S/P colonoscopy 05/15/2006   Dr Laurine Blazer anal canal hemorrhoids, otherwise normal  . S/P TKR (total knee replacement), left 04/14/2018  . Schatzki's ring 05/15/2006   Last EGD Dr Rourk-> dilated 19F, small hiatal hernia    Past Surgical History:  Procedure Laterality Date  . BUNIONECTOMY    . COLONOSCOPY  04/28/2011   Dr. Gala Romney: colonic diverticulosis, single diminutive polyp (tubular adenoma), 5 year surveillance  . COLONOSCOPY N/A 02/18/2017   Procedure: COLONOSCOPY;  Surgeon: Daneil Dolin, MD;  Location: [facility];  Service: Endoscopy;  Laterality: N/A;  1:45pm  . ESOPHAGOGASTRODUODENOSCOPY  2012   Dr. Gala Romney: erosive reflux esophagitis, Schatzki's ring s/p dilation, H.pylori gastritis  . ESOPHAGOGASTRODUODENOSCOPY N/A 02/18/2017   Procedure: ESOPHAGOGASTRODUODENOSCOPY (EGD);  Surgeon: Daneil Dolin, MD;  Location: [facility];  Service: Endoscopy;  Laterality: N/A;  . FINGER SURGERY Left    middle finger  . INGUINAL HERNIA REPAIR     bilat  . KNEE SURGERY     bilat  . LITHOTRIPSY     x2  . MALONEY DILATION N/A 02/18/2017   Procedure: Venia Minks DILATION;  Surgeon: Daneil Dolin, MD;  Location: [facility];  Service: Endoscopy;  Laterality: N/A;  . TOTAL KNEE ARTHROPLASTY Left 07/28/2016   Procedure: TOTAL KNEE ARTHROPLASTY;  Surgeon: Elsie Saas, MD;  Location: [facility];  Service: Orthopedics;  Laterality: Left;  . UMBILICAL HERNIA REPAIR      MEDICATIONS: . meloxicam (MOBIC) 15 MG tablet  . Naphazoline HCl (CLEAR EYES OP)   No current facility-administered medications for this encounter.     [facility] Short Stay Center/Anesthesiology Phone [phone] 04/16/2018 3:15 PM

## 2018-04-16 NOTE — Progress Notes (Signed)
PCP: Dr. Redmond School @ [facility]  No cardiologist

## 2018-04-17 LAB — URINE CULTURE: CULTURE: NO GROWTH

## 2018-04-19 NOTE — Anesthesia Preprocedure Evaluation (Addendum)
Anesthesia Evaluation  Patient identified by MRN, date of birth, ID band Patient awake    Reviewed: Allergy & Precautions, NPO status , Patient's Chart, lab work & pertinent test results  Airway Mallampati: I       Dental no notable dental hx. (+) Teeth Intact   Pulmonary Current Smoker,    Pulmonary exam normal breath sounds clear to auscultation       Cardiovascular negative cardio ROS Normal cardiovascular exam Rhythm:Regular Rate:Normal     Neuro/Psych negative neurological ROS  negative psych ROS   GI/Hepatic Neg liver ROS, GERD  ,  Endo/Other  negative endocrine ROS  Renal/GU negative Renal ROS  negative genitourinary   Musculoskeletal  (+) Arthritis , Osteoarthritis,    Abdominal Normal abdominal exam  (+)   Peds  Hematology negative hematology ROS (+)   Anesthesia Other Findings   Reproductive/Obstetrics                                                            Anesthesia Evaluation  Patient identified by MRN, date of birth, ID band Patient awake    Reviewed: Allergy & Precautions, NPO status , Patient's Chart, lab work & pertinent test results  Airway Mallampati: II  TM Distance: >3 FB Neck ROM: Full    Dental no notable dental hx. (+) Teeth Intact, Dental Advisory Given   Pulmonary neg pulmonary ROS,    Pulmonary exam normal breath sounds clear to auscultation       Cardiovascular negative cardio ROS Normal cardiovascular exam Rhythm:Regular Rate:Normal     Neuro/Psych negative neurological ROS  negative psych ROS   GI/Hepatic Neg liver ROS, GERD  ,  Endo/Other  negative endocrine ROS  Renal/GU negative Renal ROS  negative genitourinary   Musculoskeletal  (+) Arthritis ,   Abdominal   Peds negative pediatric ROS (+)  Hematology negative hematology ROS (+)   Anesthesia Other Findings   Reproductive/Obstetrics negative OB ROS                            Anesthesia Physical Anesthesia Plan  ASA: II  Anesthesia Plan: Spinal   Post-op Pain Management:  Regional for Post-op pain   Induction: Intravenous  Airway Management Planned: Simple Face Mask  Additional Equipment:   Intra-op Plan:   Post-operative Plan:   Informed Consent: I have reviewed the patients History and Physical, chart, labs and discussed the procedure including the risks, benefits and alternatives for the proposed anesthesia with the patient or authorized representative who has indicated his/her understanding and acceptance.   Dental advisory given  Plan Discussed with: CRNA  Anesthesia Plan Comments: (adductor)        Anesthesia Quick Evaluation  Anesthesia Physical Anesthesia Plan  ASA: II  Anesthesia Plan: Spinal   Post-op Pain Management:  Regional for Post-op pain   Induction:   PONV Risk Score and Plan: 1 and Treatment may vary due to age or medical condition, Ondansetron, Dexamethasone and Midazolam  Airway Management Planned: Natural Airway, Nasal Cannula and Simple Face Mask  Additional Equipment:   Intra-op Plan:   Post-operative Plan:   Informed Consent: I have reviewed the patients History and Physical, chart, labs and discussed the procedure including the risks, benefits and alternatives for the  proposed anesthesia with the patient or authorized representative who has indicated his/her understanding and acceptance.   Dental advisory given  Plan Discussed with: CRNA  Anesthesia Plan Comments: ( )     Anesthesia Quick Evaluation

## 2018-04-23 MED ORDER — BUPIVACAINE LIPOSOME 1.3 % IJ SUSP
20.0000 mL | INTRAMUSCULAR | Status: DC
  Filled 2018-04-23: qty 20

## 2018-04-23 MED ORDER — TRANEXAMIC ACID-NACL 1000-0.7 MG/100ML-% IV SOLN
1000.0000 mg | INTRAVENOUS | Status: DC
  Filled 2018-04-23: qty 100

## 2018-04-26 ENCOUNTER — Encounter (HOSPITAL_COMMUNITY): Payer: Self-pay | Admitting: Certified Registered Nurse Anesthetist

## 2018-04-26 ENCOUNTER — Encounter (HOSPITAL_COMMUNITY)
Admission: RE | Disposition: A | Discharge status: Home / Self Care | Payer: Self-pay | Source: Ambulatory Visit | Attending: Orthopedic Surgery

## 2018-04-26 ENCOUNTER — Ambulatory Visit (HOSPITAL_COMMUNITY): Payer: Medicare Other | Admitting: Vascular Surgery

## 2018-04-26 ENCOUNTER — Observation Stay (HOSPITAL_COMMUNITY)
Admission: RE | Admit: 2018-04-26 | Discharge: 2018-04-27 | Disposition: A | Discharge status: Home / Self Care | Payer: Medicare Other | Source: Ambulatory Visit | Attending: Orthopedic Surgery | Admitting: Orthopedic Surgery

## 2018-04-26 ENCOUNTER — Ambulatory Visit (HOSPITAL_COMMUNITY): Payer: Medicare Other | Admitting: Anesthesiology

## 2018-04-26 DIAGNOSIS — K219 Gastro-esophageal reflux disease without esophagitis: Secondary | ICD-10-CM | POA: Diagnosis present

## 2018-04-26 DIAGNOSIS — M1711 Unilateral primary osteoarthritis, right knee: Principal | ICD-10-CM | POA: Diagnosis present

## 2018-04-26 DIAGNOSIS — Z96652 Presence of left artificial knee joint: Secondary | ICD-10-CM

## 2018-04-26 DIAGNOSIS — F17210 Nicotine dependence, cigarettes, uncomplicated: Secondary | ICD-10-CM | POA: Diagnosis not present

## 2018-04-26 DIAGNOSIS — K222 Esophageal obstruction: Secondary | ICD-10-CM

## 2018-04-26 HISTORY — DX: Unilateral primary osteoarthritis, right knee: M17.11

## 2018-04-26 HISTORY — DX: Presence of left artificial knee joint: Z96.652

## 2018-04-26 HISTORY — PX: TOTAL KNEE ARTHROPLASTY: SHX125

## 2018-04-26 SURGERY — ARTHROPLASTY, KNEE, TOTAL
Anesthesia: Spinal | Site: Knee | Laterality: Right

## 2018-04-26 MED ORDER — ACETAMINOPHEN 10 MG/ML IV SOLN: 1000.0000 mg | Freq: Once | INTRAVENOUS | Status: DC | PRN

## 2018-04-26 MED ORDER — PHENYLEPHRINE 40 MCG/ML (10ML) SYRINGE FOR IV PUSH (FOR BLOOD PRESSURE SUPPORT)
PREFILLED_SYRINGE | INTRAVENOUS | Status: DC | PRN
  Administered 2018-04-26 (×2): 80 ug via INTRAVENOUS
  Administered 2018-04-26: 40 ug via INTRAVENOUS
  Administered 2018-04-26: 120 ug via INTRAVENOUS
  Administered 2018-04-26: 80 ug via INTRAVENOUS

## 2018-04-26 MED ORDER — HYDROMORPHONE HCL 1 MG/ML IJ SOLN
0.2500 mg | INTRAMUSCULAR | Status: DC | PRN
  Administered 2018-04-26 (×2): 0.5 mg via INTRAVENOUS

## 2018-04-26 MED ORDER — SODIUM CHLORIDE (PF) 0.9 % IJ SOLN
INTRAMUSCULAR | Status: DC | PRN
  Administered 2018-04-26: 50 mL via INTRAVENOUS

## 2018-04-26 MED ORDER — ALUM & MAG HYDROXIDE-SIMETH 200-200-20 MG/5ML PO SUSP: 30.0000 mL | ORAL | Status: DC | PRN

## 2018-04-26 MED ORDER — CEFUROXIME SODIUM 1.5 G IV SOLR
INTRAVENOUS | Status: AC
  Filled 2018-04-26: qty 1.5

## 2018-04-26 MED ORDER — 0.9 % SODIUM CHLORIDE (POUR BTL) OPTIME
TOPICAL | Status: DC | PRN
  Administered 2018-04-26: 1000 mL

## 2018-04-26 MED ORDER — METOCLOPRAMIDE HCL 5 MG PO TABS: 5.0000 mg | ORAL_TABLET | Freq: Three times a day (TID) | ORAL | Status: DC | PRN

## 2018-04-26 MED ORDER — CHLORHEXIDINE GLUCONATE 4 % EX LIQD: 60.0000 mL | Freq: Once | CUTANEOUS | Status: DC

## 2018-04-26 MED ORDER — POLYETHYLENE GLYCOL 3350 17 G PO PACK
17.0000 g | PACK | Freq: Two times a day (BID) | ORAL | Status: DC
  Administered 2018-04-26 – 2018-04-27 (×2): 17 g via ORAL
  Filled 2018-04-26 (×2): qty 1

## 2018-04-26 MED ORDER — PHENYLEPHRINE 40 MCG/ML (10ML) SYRINGE FOR IV PUSH (FOR BLOOD PRESSURE SUPPORT)
PREFILLED_SYRINGE | INTRAVENOUS | Status: AC
  Filled 2018-04-26: qty 10

## 2018-04-26 MED ORDER — SODIUM CHLORIDE 0.9 % IR SOLN
Status: DC | PRN
  Administered 2018-04-26: 3000 mL

## 2018-04-26 MED ORDER — GABAPENTIN 300 MG PO CAPS
300.0000 mg | ORAL_CAPSULE | Freq: Every day | ORAL | Status: DC
  Administered 2018-04-26: 300 mg via ORAL
  Filled 2018-04-26: qty 1

## 2018-04-26 MED ORDER — ONDANSETRON HCL 4 MG PO TABS: 4.0000 mg | ORAL_TABLET | Freq: Four times a day (QID) | ORAL | Status: DC | PRN

## 2018-04-26 MED ORDER — ONDANSETRON HCL 4 MG/2ML IJ SOLN
INTRAMUSCULAR | Status: DC | PRN
  Administered 2018-04-26: 4 mg via INTRAVENOUS

## 2018-04-26 MED ORDER — BUPIVACAINE IN DEXTROSE 0.75-8.25 % IT SOLN
INTRATHECAL | Status: DC | PRN
  Administered 2018-04-26: 1.6 mL via INTRATHECAL

## 2018-04-26 MED ORDER — DIPHENHYDRAMINE HCL 12.5 MG/5ML PO ELIX: 12.5000 mg | ORAL_SOLUTION | ORAL | Status: DC | PRN

## 2018-04-26 MED ORDER — MIDAZOLAM HCL 2 MG/2ML IJ SOLN: 2.0000 mg | Freq: Once | INTRAMUSCULAR | Status: DC

## 2018-04-26 MED ORDER — ROPIVACAINE HCL 5 MG/ML IJ SOLN
INTRAMUSCULAR | Status: DC | PRN
  Administered 2018-04-26 (×5): 5 mL via PERINEURAL
  Administered 2018-04-26: 5 mL via EPIDURAL

## 2018-04-26 MED ORDER — SODIUM CHLORIDE 0.9 % IV SOLN
INTRAVENOUS | Status: DC | PRN
  Administered 2018-04-26: 25 ug/min via INTRAVENOUS

## 2018-04-26 MED ORDER — PHENOL 1.4 % MT LIQD: 1.0000 | OROMUCOSAL | Status: DC | PRN

## 2018-04-26 MED ORDER — MENTHOL 3 MG MT LOZG: 1.0000 | LOZENGE | OROMUCOSAL | Status: DC | PRN

## 2018-04-26 MED ORDER — PROMETHAZINE HCL 25 MG/ML IJ SOLN: 6.2500 mg | INTRAMUSCULAR | Status: DC | PRN

## 2018-04-26 MED ORDER — BUPIVACAINE LIPOSOME 1.3 % IJ SUSP
INTRAMUSCULAR | Status: DC | PRN
  Administered 2018-04-26: 20 mL

## 2018-04-26 MED ORDER — MEPERIDINE HCL 50 MG/ML IJ SOLN: 6.2500 mg | INTRAMUSCULAR | Status: DC | PRN

## 2018-04-26 MED ORDER — TRANEXAMIC ACID-NACL 1000-0.7 MG/100ML-% IV SOLN
1000.0000 mg | Freq: Once | INTRAVENOUS | Status: AC
  Administered 2018-04-26: 1000 mg via INTRAVENOUS
  Filled 2018-04-26: qty 100

## 2018-04-26 MED ORDER — FENTANYL CITRATE (PF) 100 MCG/2ML IJ SOLN: 100.0000 ug | Freq: Once | INTRAMUSCULAR | Status: DC

## 2018-04-26 MED ORDER — SODIUM CHLORIDE 0.9 % IV BOLUS
500.0000 mL | Freq: Once | INTRAVENOUS | Status: AC
  Administered 2018-04-26: 500 mL via INTRAVENOUS

## 2018-04-26 MED ORDER — HYDROMORPHONE HCL 1 MG/ML IJ SOLN
0.5000 mg | INTRAMUSCULAR | Status: DC | PRN
  Administered 2018-04-27: 1 mg via INTRAVENOUS
  Filled 2018-04-26: qty 1

## 2018-04-26 MED ORDER — ACETAMINOPHEN 500 MG PO TABS
1000.0000 mg | ORAL_TABLET | Freq: Four times a day (QID) | ORAL | Status: DC
  Administered 2018-04-26 – 2018-04-27 (×3): 1000 mg via ORAL
  Filled 2018-04-26 (×3): qty 2

## 2018-04-26 MED ORDER — DOCUSATE SODIUM 100 MG PO CAPS
100.0000 mg | ORAL_CAPSULE | Freq: Two times a day (BID) | ORAL | Status: DC
  Administered 2018-04-26 – 2018-04-27 (×2): 100 mg via ORAL
  Filled 2018-04-26 (×2): qty 1

## 2018-04-26 MED ORDER — POVIDONE-IODINE 7.5 % EX SOLN
Freq: Once | CUTANEOUS | Status: DC
  Filled 2018-04-26: qty 118

## 2018-04-26 MED ORDER — PROPOFOL 10 MG/ML IV BOLUS
INTRAVENOUS | Status: AC
  Filled 2018-04-26: qty 20

## 2018-04-26 MED ORDER — FENTANYL CITRATE (PF) 100 MCG/2ML IJ SOLN: 25.0000 ug | INTRAMUSCULAR | Status: DC | PRN

## 2018-04-26 MED ORDER — TRANEXAMIC ACID-NACL 1000-0.7 MG/100ML-% IV SOLN
1000.0000 mg | INTRAVENOUS | Status: AC
  Administered 2018-04-26: 1000 mg via INTRAVENOUS
  Filled 2018-04-26: qty 100

## 2018-04-26 MED ORDER — PROPOFOL 10 MG/ML IV BOLUS
INTRAVENOUS | Status: DC | PRN
  Administered 2018-04-26: 20 mg via INTRAVENOUS
  Administered 2018-04-26: 30 mg via INTRAVENOUS

## 2018-04-26 MED ORDER — LACTATED RINGERS IV SOLN
INTRAVENOUS | Status: DC
  Administered 2018-04-26: 08:00:00 via INTRAVENOUS

## 2018-04-26 MED ORDER — PROPOFOL 500 MG/50ML IV EMUL
INTRAVENOUS | Status: DC | PRN
  Administered 2018-04-26: 75 ug/kg/min via INTRAVENOUS

## 2018-04-26 MED ORDER — DEXAMETHASONE SODIUM PHOSPHATE 10 MG/ML IJ SOLN
10.0000 mg | Freq: Three times a day (TID) | INTRAMUSCULAR | Status: DC
  Administered 2018-04-26 – 2018-04-27 (×2): 10 mg via INTRAVENOUS
  Filled 2018-04-26 (×3): qty 1

## 2018-04-26 MED ORDER — OXYCODONE HCL 5 MG PO TABS
5.0000 mg | ORAL_TABLET | ORAL | Status: DC | PRN
  Administered 2018-04-26 – 2018-04-27 (×5): 10 mg via ORAL
  Filled 2018-04-26 (×5): qty 2

## 2018-04-26 MED ORDER — ONDANSETRON HCL 4 MG/2ML IJ SOLN: 4.0000 mg | Freq: Four times a day (QID) | INTRAMUSCULAR | Status: DC | PRN

## 2018-04-26 MED ORDER — DEXAMETHASONE SODIUM PHOSPHATE 10 MG/ML IJ SOLN
INTRAMUSCULAR | Status: DC | PRN
  Administered 2018-04-26: 10 mg via INTRAVENOUS

## 2018-04-26 MED ORDER — HYDROMORPHONE HCL 1 MG/ML IJ SOLN
INTRAMUSCULAR | Status: AC
  Filled 2018-04-26: qty 1

## 2018-04-26 MED ORDER — ASPIRIN EC 325 MG PO TBEC
325.0000 mg | DELAYED_RELEASE_TABLET | Freq: Every day | ORAL | Status: DC
  Administered 2018-04-27: 325 mg via ORAL
  Filled 2018-04-26: qty 1

## 2018-04-26 MED ORDER — METOCLOPRAMIDE HCL 5 MG/ML IJ SOLN: 5.0000 mg | Freq: Three times a day (TID) | INTRAMUSCULAR | Status: DC | PRN

## 2018-04-26 MED ORDER — MIDAZOLAM HCL 2 MG/2ML IJ SOLN
INTRAMUSCULAR | Status: AC
  Administered 2018-04-26: 2 mg
  Filled 2018-04-26: qty 2

## 2018-04-26 MED ORDER — ONDANSETRON HCL 4 MG/2ML IJ SOLN
INTRAMUSCULAR | Status: AC
  Filled 2018-04-26: qty 2

## 2018-04-26 MED ORDER — FENTANYL CITRATE (PF) 250 MCG/5ML IJ SOLN
INTRAMUSCULAR | Status: AC
  Filled 2018-04-26: qty 5

## 2018-04-26 MED ORDER — BUPIVACAINE-EPINEPHRINE 0.5% -1:200000 IJ SOLN
INTRAMUSCULAR | Status: DC | PRN
  Administered 2018-04-26: 50 mL

## 2018-04-26 MED ORDER — CEFAZOLIN SODIUM-DEXTROSE 2-4 GM/100ML-% IV SOLN
2.0000 g | Freq: Four times a day (QID) | INTRAVENOUS | Status: AC
  Administered 2018-04-26 – 2018-04-27 (×2): 2 g via INTRAVENOUS
  Filled 2018-04-26 (×2): qty 100

## 2018-04-26 MED ORDER — POTASSIUM CHLORIDE IN NACL 20-0.9 MEQ/L-% IV SOLN
INTRAVENOUS | Status: DC
  Administered 2018-04-27: 01:00:00 via INTRAVENOUS
  Filled 2018-04-26: qty 1000

## 2018-04-26 MED ORDER — CEFAZOLIN SODIUM-DEXTROSE 2-4 GM/100ML-% IV SOLN
2.0000 g | INTRAVENOUS | Status: AC
  Administered 2018-04-26: 2 g via INTRAVENOUS
  Filled 2018-04-26: qty 100

## 2018-04-26 MED ORDER — DEXAMETHASONE SODIUM PHOSPHATE 10 MG/ML IJ SOLN
INTRAMUSCULAR | Status: AC
  Filled 2018-04-26: qty 1

## 2018-04-26 MED ORDER — FENTANYL CITRATE (PF) 100 MCG/2ML IJ SOLN
INTRAMUSCULAR | Status: AC
  Administered 2018-04-26: 100 ug
  Filled 2018-04-26: qty 2

## 2018-04-26 SURGICAL SUPPLY — 84 items
APL SKNCLS STERI-STRIP NONHPOA (GAUZE/BANDAGES/DRESSINGS) ×1
ATTUNE MED DOME PAT 41 KNEE (Knees) ×1 IMPLANT
ATTUNE MED DOME PAT 41MM KNEE (Knees) ×1 IMPLANT
ATTUNE PS FEM RT SZ 6 CEM KNEE (Femur) ×2 IMPLANT
ATTUNE PSRP INSR SZ6 6 KNEE (Insert) ×1 IMPLANT
ATTUNE PSRP INSR SZ6 6MM KNEE (Insert) ×1 IMPLANT
BANDAGE ESMARK 6X9 LF (GAUZE/BANDAGES/DRESSINGS) ×1 IMPLANT
BASE TIBIAL ROT PLAT SZ 8 KNEE (Knees) IMPLANT
BENZOIN TINCTURE PRP APPL 2/3 (GAUZE/BANDAGES/DRESSINGS) ×3 IMPLANT
BLADE SAGITTAL 25.0X1.19X90 (BLADE) ×2 IMPLANT
BLADE SAGITTAL 25.0X1.19X90MM (BLADE) ×1
BLADE SAW SGTL 13X75X1.27 (BLADE) ×3 IMPLANT
BLADE SURG 10 STRL SS (BLADE) ×6 IMPLANT
BNDG CMPR 9X6 STRL LF SNTH (GAUZE/BANDAGES/DRESSINGS) ×1
BNDG CMPR MED 10X6 ELC LF (GAUZE/BANDAGES/DRESSINGS) ×1
BNDG ELASTIC 6X10 VLCR STRL LF (GAUZE/BANDAGES/DRESSINGS) ×2 IMPLANT
BNDG ESMARK 6X9 LF (GAUZE/BANDAGES/DRESSINGS) ×3
BOWL SMART MIX CTS (DISPOSABLE) ×3 IMPLANT
BSPLAT TIB 8 CMNT ROT PLAT STR (Knees) ×1 IMPLANT
CEMENT HV SMART SET (Cement) ×6 IMPLANT
CLOSURE WOUND 1/2 X4 (GAUZE/BANDAGES/DRESSINGS) ×1
COVER SURGICAL LIGHT HANDLE (MISCELLANEOUS) ×3 IMPLANT
COVER WAND RF STERILE (DRAPES) ×3 IMPLANT
CUFF TOURNIQUET SINGLE 34IN LL (TOURNIQUET CUFF) ×3 IMPLANT
CUFF TOURNIQUET SINGLE 44IN (TOURNIQUET CUFF) IMPLANT
DECANTER SPIKE VIAL GLASS SM (MISCELLANEOUS) ×3 IMPLANT
DRAPE EXTREMITY T 121X128X90 (DRAPE) ×3 IMPLANT
DRAPE HALF SHEET 40X57 (DRAPES) ×6 IMPLANT
DRAPE INCISE IOBAN 66X45 STRL (DRAPES) ×4 IMPLANT
DRAPE ORTHO SPLIT 77X108 STRL (DRAPES) ×3
DRAPE SURG ORHT 6 SPLT 77X108 (DRAPES) ×1 IMPLANT
DRAPE U-SHAPE 47X51 STRL (DRAPES) ×3 IMPLANT
DRSG AQUACEL AG ADV 3.5X10 (GAUZE/BANDAGES/DRESSINGS) ×3 IMPLANT
DURAPREP 26ML APPLICATOR (WOUND CARE) ×5 IMPLANT
ELECT CAUTERY BLADE 6.4 (BLADE) ×3 IMPLANT
ELECT REM PT RETURN 9FT ADLT (ELECTROSURGICAL) ×3
ELECTRODE REM PT RTRN 9FT ADLT (ELECTROSURGICAL) ×1 IMPLANT
FACESHIELD WRAPAROUND (MASK) ×3 IMPLANT
FACESHIELD WRAPAROUND OR TEAM (MASK) ×1 IMPLANT
GLOVE BIO SURGEON STRL SZ7 (GLOVE) ×3 IMPLANT
GLOVE BIOGEL PI IND STRL 7.0 (GLOVE) ×1 IMPLANT
GLOVE BIOGEL PI IND STRL 7.5 (GLOVE) ×1 IMPLANT
GLOVE BIOGEL PI INDICATOR 7.0 (GLOVE) ×2
GLOVE BIOGEL PI INDICATOR 7.5 (GLOVE) ×2
GLOVE SS BIOGEL STRL SZ 7.5 (GLOVE) ×1 IMPLANT
GLOVE SUPERSENSE BIOGEL SZ 7.5 (GLOVE) ×2
GOWN STRL REUS W/ TWL LRG LVL3 (GOWN DISPOSABLE) ×1 IMPLANT
GOWN STRL REUS W/ TWL XL LVL3 (GOWN DISPOSABLE) ×1 IMPLANT
GOWN STRL REUS W/TWL LRG LVL3 (GOWN DISPOSABLE) ×9
GOWN STRL REUS W/TWL XL LVL3 (GOWN DISPOSABLE) ×6
HANDPIECE INTERPULSE COAX TIP (DISPOSABLE) ×3
HOOD PEEL AWAY FACE SHEILD DIS (HOOD) ×8 IMPLANT
IMMOBILIZER KNEE 22 UNIV (SOFTGOODS) ×3 IMPLANT
KIT BASIN OR (CUSTOM PROCEDURE TRAY) ×3 IMPLANT
KIT TURNOVER KIT B (KITS) ×3 IMPLANT
MANIFOLD NEPTUNE II (INSTRUMENTS) ×3 IMPLANT
MARKER SKIN DUAL TIP RULER LAB (MISCELLANEOUS) ×3 IMPLANT
NDL 18GX1X1/2 (RX/OR ONLY) (NEEDLE) IMPLANT
NEEDLE 18GX1X1/2 (RX/OR ONLY) (NEEDLE) ×3 IMPLANT
NEEDLE HYPO 22GX1.5 SAFETY (NEEDLE) ×6 IMPLANT
NS IRRIG 1000ML POUR BTL (IV SOLUTION) ×3 IMPLANT
PACK TOTAL JOINT (CUSTOM PROCEDURE TRAY) ×3 IMPLANT
PAD ARMBOARD 7.5X6 YLW CONV (MISCELLANEOUS) ×6 IMPLANT
PIN STEINMAN FIXATION KNEE (PIN) ×2 IMPLANT
PIN THREADED HEADED SIGMA (PIN) ×2 IMPLANT
SET HNDPC FAN SPRY TIP SCT (DISPOSABLE) ×1 IMPLANT
STRIP CLOSURE SKIN 1/2X4 (GAUZE/BANDAGES/DRESSINGS) ×2 IMPLANT
SUCTION FRAZIER HANDLE 10FR (MISCELLANEOUS) ×2
SUCTION TUBE FRAZIER 10FR DISP (MISCELLANEOUS) ×1 IMPLANT
SUT MNCRL AB 3-0 PS2 18 (SUTURE) ×3 IMPLANT
SUT VIC AB 0 CT1 27 (SUTURE) ×6
SUT VIC AB 0 CT1 27XBRD ANBCTR (SUTURE) ×2 IMPLANT
SUT VIC AB 1 CT1 27 (SUTURE) ×3
SUT VIC AB 1 CT1 27XBRD ANBCTR (SUTURE) ×1 IMPLANT
SUT VIC AB 2-0 CT1 27 (SUTURE) ×6
SUT VIC AB 2-0 CT1 TAPERPNT 27 (SUTURE) ×2 IMPLANT
SYR CONTROL 10ML LL (SYRINGE) ×6 IMPLANT
TIBIAL BASE ROT PLAT SZ 8 KNEE (Knees) ×3 IMPLANT
TOWEL OR 17X24 6PK STRL BLUE (TOWEL DISPOSABLE) ×3 IMPLANT
TOWEL OR 17X26 10 PK STRL BLUE (TOWEL DISPOSABLE) ×3 IMPLANT
TRAY CATH 16FR W/PLASTIC CATH (SET/KITS/TRAYS/PACK) IMPLANT
TRAY FOLEY CATH SILVER 16FR (SET/KITS/TRAYS/PACK) ×3 IMPLANT
WATER STERILE IRR 1000ML POUR (IV SOLUTION) ×3 IMPLANT
YANKAUER SUCT BULB TIP NO VENT (SUCTIONS) ×2 IMPLANT

## 2018-04-26 NOTE — Transfer of Care (Signed)
Immediate Anesthesia Transfer of Care Note  Patient: Dustin Jacobson  Procedure(s) Performed: TOTAL KNEE ARTHROPLASTY (Right Knee)  Patient Location: PACU  Anesthesia Type:Spinal and MAC combined with regional for post-op pain  Level of Consciousness: awake, alert  and oriented  Airway & Oxygen Therapy: Patient Spontanous Breathing and Patient connected to face mask oxygen  Post-op Assessment: Report given to RN and Post -op Vital signs reviewed and stable  Post vital signs: Reviewed and stable  Last Vitals:  Vitals Value Taken Time  BP    Temp    Pulse 73 04/26/2018 11:34 AM  Resp 16 04/26/2018 11:34 AM  SpO2 98 % 04/26/2018 11:34 AM  Vitals shown include unvalidated device data.  Last Pain:  Vitals:   04/26/18 0830  TempSrc:   PainSc: 0-No pain         Complications: No apparent anesthesia complications

## 2018-04-26 NOTE — Anesthesia Postprocedure Evaluation (Signed)
Anesthesia Post Note  Patient: Dustin Jacobson  Procedure(s) Performed: TOTAL KNEE ARTHROPLASTY (Right Knee)     Patient location during evaluation: PACU Anesthesia Type: Spinal Level of consciousness: awake Pain management: pain level controlled Vital Signs Assessment: post-procedure vital signs reviewed and stable Respiratory status: spontaneous breathing Cardiovascular status: stable Postop Assessment: no headache, no backache, spinal receding, patient able to bend at knees and no apparent nausea or vomiting Anesthetic complications: no    Last Vitals:  Vitals:   04/26/18 1400 04/26/18 1415  BP: 110/62   Pulse: 60 67  Resp: 12 19  Temp: (!) 36.3 C   SpO2: 98% 99%    Last Pain:  Vitals:   04/26/18 1230  TempSrc:   PainSc: 0-No pain   Pain Goal:                 Huston Foley

## 2018-04-26 NOTE — Progress Notes (Signed)
Orthopedic Tech Progress Note Patient Details:  Dustin Jacobson Nov 17, 1945 [id number]  CPM Right Knee CPM Right Knee: On Right Knee Flexion (Degrees): 90 Right Knee Extension (Degrees): 0  Post Interventions Patient Tolerated: Well Instructions Provided: Care of device, Adjustment of device  Karolee Stamps 04/26/2018, 12:27 PM

## 2018-04-26 NOTE — Anesthesia Procedure Notes (Signed)
Anesthesia Regional Block: Adductor canal block   Pre-Anesthetic Checklist: ,, timeout performed, Correct Patient, Correct Site, Correct Laterality, Correct Procedure, Correct Position, site marked, Risks and benefits discussed,  Surgical consent,  Pre-op evaluation,  At surgeon's request and post-op pain management  Laterality: Right and Lower  Prep: Dura Prep       Needles:  Injection technique: Single-shot  Needle Type: Echogenic Stimulator Needle     Needle Length: 9cm  Needle Gauge: 21   Needle insertion depth: 2 cm   Additional Needles:   Procedures:,,,, ultrasound used (permanent image in chart),,,,  Narrative:  Start time: 04/26/2018 8:22 AM End time: 04/26/2018 8:32 AM Injection made incrementally with aspirations every 5 mL.  Performed by: Personally  Anesthesiologist: Lyn Hollingshead, MD

## 2018-04-26 NOTE — Evaluation (Signed)
Physical Therapy Evaluation Patient Details Name: Dustin Jacobson MRN: [id number] DOB: 12/05/1945 Today's Date: 04/26/2018   History of Present Illness  Pt is a 72 y/o male s/p elective R TKA. PMH includes schatzki ring and L TKA.   Clinical Impression  Pt is s/p surgery above with deficits below. Pt tolerated gait training well this session. Required min to min guard A for mobility using RW. Reviewed knee precautions and supine HEP. Will continue to follow acutely to maximize functional mobility independence and safety.     Follow Up Recommendations Follow surgeon's recommendation for DC plan and follow-up therapies;Supervision for mobility/OOB    Equipment Recommendations  None recommended by PT    Recommendations for Other Services       Precautions / Restrictions Precautions Precautions: Knee Precaution Booklet Issued: Yes (comment) Precaution Comments: Reviewed knee precautions with pt.  Required Braces or Orthoses: Knee Immobilizer - Right Knee Immobilizer - Right: Other (comment)(Until good quad control ) Restrictions Weight Bearing Restrictions: Yes RLE Weight Bearing: Weight bearing as tolerated      Mobility  Bed Mobility Overal bed mobility: Needs Assistance Bed Mobility: Supine to Sit     Supine to sit: Supervision     General bed mobility comments: Supervision for safety. Increased time required.   Transfers Overall transfer level: Needs assistance Equipment used: Rolling walker (2 wheeled) Transfers: Sit to/from Stand Sit to Stand: Min assist         General transfer comment: Min A for steadying assist. Cues for safe hand placement.   Ambulation/Gait Ambulation/Gait assistance: Min guard Gait Distance (Feet): 75 Feet Assistive device: Rolling walker (2 wheeled) Gait Pattern/deviations: Step-to pattern;Decreased step length - right;Decreased step length - left;Decreased weight shift to right;Antalgic Gait velocity: Decreased    General Gait  Details: Slow, antalgic gait. Decreased coordination noted in RLE, likely secondary to spinal block. Cues for sequencing using RW and for increased step length bilaterally.   Stairs            Wheelchair Mobility    Modified Rankin (Stroke Patients Only)       Balance Overall balance assessment: Needs assistance Sitting-balance support: No upper extremity supported;Feet supported Sitting balance-Leahy Scale: Fair     Standing balance support: Bilateral upper extremity supported;During functional activity Standing balance-Leahy Scale: Poor Standing balance comment: Reliant on BUE support                             Pertinent Vitals/Pain Pain Assessment: Faces Faces Pain Scale: Hurts little more Pain Location: R knee  Pain Descriptors / Indicators: Aching;Operative site guarding Pain Intervention(s): Limited activity within patient's tolerance;Monitored during session;Repositioned    Home Living Family/patient expects to be discharged to:: Private residence Living Arrangements: Spouse/significant other Available Help at Discharge: Family;Available 24 hours/day Type of Home: House Home Access: Stairs to enter Entrance Stairs-Rails: None Entrance Stairs-Number of Steps: 3 Home Layout: One level Home Equipment: Environmental consultant - 2 wheels      Prior Function Level of Independence: Independent               Hand Dominance        Extremity/Trunk Assessment   Upper Extremity Assessment Upper Extremity Assessment: Overall WFL for tasks assessed    Lower Extremity Assessment Lower Extremity Assessment: RLE deficits/detail RLE Deficits / Details: Deficits consistent with post op pain and weakness.     Cervical / Trunk Assessment Cervical / Trunk Assessment: Normal  Communication  Communication: No difficulties  Cognition Arousal/Alertness: Awake/alert Behavior During Therapy: WFL for tasks assessed/performed Overall Cognitive Status: Within  Functional Limits for tasks assessed                                        General Comments General comments (skin integrity, edema, etc.): Pt's wife present throughout session     Exercises Total Joint Exercises Ankle Circles/Pumps: AROM;Both;20 reps Quad Sets: AROM;Right;10 reps   Assessment/Plan    PT Assessment Patient needs continued PT services  PT Problem List Decreased strength;Decreased balance;Decreased mobility;Decreased coordination;Decreased knowledge of use of DME;Decreased knowledge of precautions;Pain       PT Treatment Interventions DME instruction;Gait training;Stair training;Functional mobility training;Therapeutic activities;Therapeutic exercise;Balance training;Patient/family education    PT Goals (Current goals can be found in the Care Plan section)  Acute Rehab PT Goals Patient Stated Goal: to go home  PT Goal Formulation: With patient Time For Goal Achievement: 05/10/18 Potential to Achieve Goals: Good    Frequency 7X/week   Barriers to discharge        Co-evaluation               AM-PAC PT "6 Clicks" Mobility  Outcome Measure Help needed turning from your back to your side while in a flat bed without using bedrails?: None Help needed moving from lying on your back to sitting on the side of a flat bed without using bedrails?: None Help needed moving to and from a bed to a chair (including a wheelchair)?: A Little Help needed standing up from a chair using your arms (e.g., wheelchair or bedside chair)?: A Little Help needed to walk in hospital room?: A Little Help needed climbing 3-5 steps with a railing? : A Little 6 Click Score: 20    End of Session Equipment Utilized During Treatment: Gait belt;Right knee immobilizer Activity Tolerance: Patient tolerated treatment well Patient left: in chair;with call bell/phone within reach;with family/visitor present Nurse Communication: Mobility status PT Visit Diagnosis: Other  abnormalities of gait and mobility (R26.89);Pain Pain - Right/Left: Right Pain - part of body: Knee    Time: 7893-8101 PT Time Calculation (min) (ACUTE ONLY): 28 min   Charges:   PT Evaluation $PT Eval Low Complexity: 1 Low PT Treatments $Gait Training: 8-22 mins        Leighton Ruff, PT, DPT  Acute Rehabilitation Services  Pager: [phone] Office: [phone]   Dustin Jacobson 04/26/2018, 5:43 PM

## 2018-04-26 NOTE — Op Note (Signed)
MRN:     [id number] DOB/AGE:    71-Aug-1947 / 71 y.o.       OPERATIVE REPORT   DATE OF PROCEDURE:  04/26/2018      PREOPERATIVE DIAGNOSIS:   Primary Localized Osteoarthritis right Knee       Estimated body mass index is 26.61 kg/m as calculated from the following:   Height as of this encounter: 5\' 8"  (1.727 m).   Weight as of this encounter: 79.4 kg.                                                       POSTOPERATIVE DIAGNOSIS:   Same                                                                 PROCEDURE:  Procedure(s): TOTAL KNEE ARTHROPLASTY Using Depuy Attune RP implants #6 Femur, #8Tibia, 54mm  RP bearing, 41 Patella    SURGEON: Sharilynn Cassity A. Noemi Chapel, MD   ASSISTANT: Matthew Saras, PA-C, present and scrubbed throughout the case, critical for retraction, instrumentation, and closure.  ANESTHESIA: Spinal with Adductor Nerve Block  TOURNIQUET TIME: 65 minutes   COMPLICATIONS:  None       SPECIMENS: None   INDICATIONS FOR PROCEDURE: The patient has djd of the knee with varus deformities, XR shows bone on bone arthritis. Patient has failed all conservative measures including anti-inflammatory medicines, narcotics, attempts at exercise and weight loss, cortisone injections and viscosupplementation.  Risks and benefits of surgery have been discussed, questions answered.    DESCRIPTION OF PROCEDURE: The patient identified by armband, received right adductor canal block and IV antibiotics, in the holding area at [facility]. Patient taken to the operating room, appropriate anesthetic monitors were attached. Spinal anesthesia induced with the patient in supine position, Foley catheter was inserted. Tourniquet applied high to the operative thigh. Lateral post and foot positioner applied to the table, the lower extremity was then prepped and draped in usual sterile fashion from the ankle to the tourniquet. Time-out procedure was performed. The limb was wrapped with an Esmarch bandage  and the tourniquet inflated to 365 mmHg.   We began the operation by making a 6cm anterior midline incision. Small bleeders in the skin and the subcutaneous tissue identified and cauterized. Transverse retinaculum was incised and reflected medially and a medial parapatellar arthrotomy was accomplished. the patella was everted and theprepatellar fat pad resected. The superficial medial collateral ligament was then elevated from anterior to posterior along the proximal flare of the tibia and anterior half of the menisci resected. The knee was hyperflexed exposing bone on bone arthritis. Peripheral and notch osteophytes as well as the cruciate ligaments were then resected. We continued to work our way around posteriorly along the proximal tibia, and externally rotated the tibia subluxing it out from underneath the femur. A McHale retractor was placed through the notch and a lateral Hohmann retractor placed, and an external tibial guide was placed.  The tibial cutting guide was pinned into place allowing resection of 8 mm of bone medially and about 2 mm of bone laterally because of her  valgus deformity.   Satisfied with the tibial resection, we then entered the distal femur 2 mm anterior to the PCL origin with the intramedullary guide rod and applied the distal femoral cutting guide set at 44mm, with 5 degrees of valgus. This was pinned along the epicondylar axis. At this point, the distal femoral cut was accomplished without difficulty. We then sized for a 6 femoral component and pinned the guide in 3 degrees of external rotation.The chamfer cutting guide was pinned into place. The anterior, posterior, and chamfer cuts were accomplished without difficulty followed by the  RP box cutting guide and the box cut. We also removed posterior osteophytes from the posterior femoral condyles. At this time, the knee was brought into full extension. We checked our extension and flexion gaps and found them symmetric at  6.  The patella thickness measured at 6m m. We set the cutting guide at 15 and removed the posterior patella sized for 41 button and drilled the lollipop. The knee was then once again hyperflexed exposing the proximal tibia. We sized for a # 8 tibial base plate, applied the smokestack and the conical reamer followed by the the Delta fin keel punch. We then hammered into place the  RP trial femoral component, inserted a trial bearing, trial patellar button, and took the knee through range of motion from 0-130 degrees. No thumb pressure was required for patellar tracking.   At this point, all trial components were removed, a double batch of DePuy HV cement  was mixed and applied to all bony metallic mating surfaces. In order, we hammered into place the tibial tray and removed excess cement, the femoral component and removed excess cement, a 6 mm  RP bearing was inserted, and the knee brought to full extension with compression. The patellar button was clamped into place, and excess cement removed. While the cement cured the wound was irrigated out with normal saline solution pulse lavage, and exparel was injected throughout the knee. Ligament stability and patellar tracking were checked and found to be excellent..   The parapatellar arthrotomy was closed with  #1 Vicryl suture. The subcutaneous tissue with 0 and 2-0 undyed Vicryl suture, and 4-0 Monocryl.. A dressing of Aquaseal, 4 x 4, dressing sponges, Webril, and Ace wrap applied. Needle and sponge count were correct times 2.The patient awakened, extubated, and taken to recovery room without difficulty. Vascular status was normal, pulses 2+ and symmetric.    [name] 08/17/2017, 8:56 AM

## 2018-04-26 NOTE — Anesthesia Procedure Notes (Signed)
Spinal  Patient location during procedure: OR Start time: 04/26/2018 9:19 AM End time: 04/26/2018 9:22 AM Staffing Anesthesiologist: Lyn Hollingshead, MD Performed: anesthesiologist  Preanesthetic Checklist Completed: patient identified, site marked, surgical consent, pre-op evaluation, timeout performed, IV checked, risks and benefits discussed and monitors and equipment checked Spinal Block Patient position: sitting Prep: site prepped and draped and DuraPrep Patient monitoring: continuous pulse ox and blood pressure Approach: midline Location: L3-4 Injection technique: single-shot Needle Needle type: Pencan  Needle gauge: 24 G Needle length: 10 cm Needle insertion depth: 6 cm Assessment Sensory level: T8

## 2018-04-26 NOTE — Plan of Care (Signed)
  Problem: Clinical Measurements: Goal: Will remain free from infection Outcome: Progressing   Problem: Activity: Goal: Risk for activity intolerance will decrease Outcome: Progressing   Problem: Pain Managment: Goal: General experience of comfort will improve Outcome: Progressing   

## 2018-04-26 NOTE — Interval H&P Note (Signed)
History and Physical Interval Note:  04/26/2018 8:53 AM  Dustin Jacobson  has presented today for surgery, with the diagnosis of OA RIGHT KNEE  The various methods of treatment have been discussed with the patient and family. After consideration of risks, benefits and other options for treatment, the patient has consented to  Procedure(s): TOTAL KNEE ARTHROPLASTY (Right) as a surgical intervention .  The patient's history has been reviewed, patient examined, no change in status, stable for surgery.  I have reviewed the patient's chart and labs.  Questions were answered to the patient's satisfaction.     [name]

## 2018-04-26 NOTE — Anesthesia Procedure Notes (Signed)
Procedure Name: MAC Date/Time: 04/26/2018 9:16 AM Performed by: Candis Shine, CRNA Pre-anesthesia Checklist: Patient identified, Emergency Drugs available, Suction available, Patient being monitored and Timeout performed Patient Re-evaluated:Patient Re-evaluated prior to induction Oxygen Delivery Method: Simple face mask Dental Injury: Teeth and Oropharynx as per pre-operative assessment

## 2018-04-27 ENCOUNTER — Other Ambulatory Visit: Payer: Self-pay

## 2018-04-27 ENCOUNTER — Encounter (HOSPITAL_COMMUNITY): Payer: Self-pay | Admitting: Orthopedic Surgery

## 2018-04-27 DIAGNOSIS — M1711 Unilateral primary osteoarthritis, right knee: Secondary | ICD-10-CM | POA: Diagnosis not present

## 2018-04-27 LAB — CBC
HCT: 40.8 % (ref 39.0–52.0)
Hemoglobin: 13.2 g/dL (ref 13.0–17.0)
MCH: 29.5 pg (ref 26.0–34.0)
MCHC: 32.4 g/dL (ref 30.0–36.0)
MCV: 91.1 fL (ref 80.0–100.0)
Platelets: 198 10*3/uL (ref 150–400)
RBC: 4.48 MIL/uL (ref 4.22–5.81)
RDW: 13.2 % (ref 11.5–15.5)
WBC: 13.6 10*3/uL — ABNORMAL HIGH (ref 4.0–10.5)
nRBC: 0 % (ref 0.0–0.2)

## 2018-04-27 LAB — BASIC METABOLIC PANEL
ANION GAP: 11 (ref 5–15)
BUN: 18 mg/dL (ref 8–23)
CO2: 21 mmol/L — ABNORMAL LOW (ref 22–32)
Calcium: 8.4 mg/dL — ABNORMAL LOW (ref 8.9–10.3)
Chloride: 102 mmol/L (ref 98–111)
Creatinine, Ser: 1.18 mg/dL (ref 0.61–1.24)
GFR calc Af Amer: >60 mL/min (ref >60)
GLUCOSE: 172 mg/dL — AB (ref 70–99)
Potassium: 4 mmol/L (ref 3.5–5.1)
Sodium: 134 mmol/L — ABNORMAL LOW (ref 135–145)

## 2018-04-27 MED ORDER — POLYETHYLENE GLYCOL 3350 17 G PO PACK: PACK | ORAL | 0 refills | Status: DC

## 2018-04-27 MED ORDER — GABAPENTIN 300 MG PO CAPS: ORAL_CAPSULE | ORAL | 0 refills | Status: DC

## 2018-04-27 MED ORDER — OXYCODONE HCL 5 MG PO TABS: ORAL_TABLET | ORAL | 0 refills | Status: DC

## 2018-04-27 MED ORDER — ASPIRIN 325 MG PO TBEC: DELAYED_RELEASE_TABLET | ORAL | 0 refills | Status: DC

## 2018-04-27 MED ORDER — DOCUSATE SODIUM 100 MG PO CAPS: ORAL_CAPSULE | ORAL | 0 refills | Status: DC

## 2018-04-27 NOTE — Progress Notes (Signed)
Physical Therapy Treatment Patient Details Name: Dustin Jacobson MRN: [id number] DOB: 1946/02/18 Today's Date: 04/27/2018    History of Present Illness Pt is a 72 y/o male s/p elective R TKA. PMH includes schatzki ring and L TKA.     PT Comments    Stair training complete. Pt to d/c home today. Pt/wife report no further questions or concerns.   Follow Up Recommendations  Follow surgeon's recommendation for DC plan and follow-up therapies;Supervision for mobility/OOB     Equipment Recommendations  None recommended by PT    Recommendations for Other Services       Precautions / Restrictions Precautions Precautions: Knee Precaution Comments: Reviewed knee precautions with pt.  Restrictions RLE Weight Bearing: Weight bearing as tolerated Other Position/Activity Restrictions: d/c'd KI 12/3 as pt able to complete SLR with no knee lag.    Mobility  Bed Mobility Overal bed mobility: Modified Independent             General bed mobility comments: increased time and effort, no physical assist, no rail  Transfers Overall transfer level: Needs assistance Equipment used: Rolling walker (2 wheeled) Transfers: Sit to/from Stand Sit to Stand: Modified independent (Device/Increase time)         General transfer comment: supervision for safety  Ambulation/Gait Ambulation/Gait assistance: Modified independent (Device/Increase time);Supervision Gait Distance (Feet): 150 Feet Assistive device: Rolling walker (2 wheeled) Gait Pattern/deviations: Step-through pattern;Decreased stride length;Antalgic;Decreased weight shift to right Gait velocity: Decreased  Gait velocity interpretation: 1.31 - 2.62 ft/sec, indicative of limited community ambulator General Gait Details: cues for heel strike/toe off   Stairs Stairs: Yes Stairs assistance: Min guard Stair Management: No rails;Step to pattern;Forwards Number of Stairs: 5 General stair comments: cues for sequencing   Wheelchair  Mobility    Modified Rankin (Stroke Patients Only)       Balance Overall balance assessment: Needs assistance Sitting-balance support: No upper extremity supported;Feet supported Sitting balance-Leahy Scale: Good     Standing balance support: Bilateral upper extremity supported;During functional activity Standing balance-Leahy Scale: Fair                              Cognition Arousal/Alertness: Awake/alert Behavior During Therapy: WFL for tasks assessed/performed Overall Cognitive Status: Within Functional Limits for tasks assessed                                        Exercises Total Joint Exercises Ankle Circles/Pumps: AROM;Both;20 reps Quad Sets: AROM;Both;10 reps Short Arc Quad: AROM;Right;10 reps Heel Slides: AROM;Right;10 reps Hip ABduction/ADduction: AROM;Right;10 reps Straight Leg Raises: AROM;Right;10 reps Goniometric ROM: 5-95 R knee    General Comments General comments (skin integrity, edema, etc.): Pt's wife present during session.      Pertinent Vitals/Pain Pain Assessment: Faces Faces Pain Scale: Hurts a little bit Pain Location: R knee  Pain Descriptors / Indicators: Grimacing;Guarding Pain Intervention(s): Monitored during session    Home Living                      Prior Function            PT Goals (current goals can now be found in the care plan section) Acute Rehab PT Goals Patient Stated Goal: home today PT Goal Formulation: With patient Time For Goal Achievement: 05/10/18 Potential to Achieve Goals: Good Progress towards PT goals: Progressing  toward goals    Frequency    7X/week      PT Plan Current plan remains appropriate    Co-evaluation              AM-PAC PT "6 Clicks" Mobility   Outcome Measure  Help needed turning from your back to your side while in a flat bed without using bedrails?: None Help needed moving from lying on your back to sitting on the side of a flat bed  without using bedrails?: None Help needed moving to and from a bed to a chair (including a wheelchair)?: None Help needed standing up from a chair using your arms (e.g., wheelchair or bedside chair)?: None Help needed to walk in hospital room?: A Little Help needed climbing 3-5 steps with a railing? : A Little 6 Click Score: 22    End of Session Equipment Utilized During Treatment: Gait belt Activity Tolerance: Patient tolerated treatment well Patient left: in bed;with call bell/phone within reach;with family/visitor present Nurse Communication: Mobility status PT Visit Diagnosis: Other abnormalities of gait and mobility (R26.89);Pain Pain - Right/Left: Right Pain - part of body: Knee     Time: 8937-3428 PT Time Calculation (min) (ACUTE ONLY): 10 min  Charges:  $Gait Training: 8-22 mins $Therapeutic Exercise: 8-22 mins                     Lorrin Goodell, PT  Office # [phone] Pager [phone]    Lorriane Shire 04/27/2018, 1:34 PM

## 2018-04-27 NOTE — Care Management Note (Signed)
Case Management Note  Patient Details  Name: Dustin Jacobson MRN: [id number] Date of Birth: 06/17/1945  Subjective/Objective:  72 yr old gentleman s/p right total knee arthroplasty.                   Action/Plan: Case manager spoke with patient and his wife concerning discharge plan and DME needs. Patient was preoperatively setup with [facility], and used them with previous knee surgery, no changes. He will have family support at discharge.   Expected Discharge Date:    04/27/18              Expected Discharge Plan:  [place]  In-House Referral:  NA  Discharge planning Services  CM Consult  Post Acute Care Choice:  Durable Medical Equipment, Home Health Choice offered to:  Patient, Spouse  DME Arranged:  CPM(has RW and 3in1) DME Agency:  [employer]  HH Arranged:  PT [place] Agency:  [facility] (formerly [employer])  Status of Service:  Completed, signed off  If discussed at [employer], dates discussed:    Additional Comments:  Ninfa Meeker, RN 04/27/2018, 11:04 AM

## 2018-04-27 NOTE — Discharge Summary (Addendum)
Patient ID: Dustin Jacobson MRN: [id number] DOB/AGE: [birth year] 72 y.o.  Admit date: 04/26/2018 Discharge date: 04/27/2018  Admission Diagnoses:  Principal Problem:   Primary localized osteoarthritis of right knee Active Problems:   GERD (gastroesophageal reflux disease)   Schatzki's ring   S/P TKR (total knee replacement), left   Discharge Diagnoses:  Same  Past Medical History:  Diagnosis Date  . Adenomatous colon polyp 2002  . Early cataract    left  . GERD (gastroesophageal reflux disease)   . History of kidney stones   . Primary localized osteoarthritis of left knee   . Primary localized osteoarthritis of left knee   . Primary localized osteoarthritis of right knee 04/14/2018  . S/P colonoscopy 05/15/2006   Dr Laurine Blazer anal canal hemorrhoids, otherwise normal  . S/P TKR (total knee replacement), left 04/14/2018  . Schatzki's ring 05/15/2006   Last EGD Dr Rourk-> dilated 40F, small hiatal hernia    Surgeries: Procedure(s): TOTAL KNEE ARTHROPLASTY on 04/26/2018   Consultants:   Discharged Condition: Improved  Hospital Course: Dustin Jacobson is an 72 y.o. male who was admitted 04/26/2018 for operative treatment ofPrimary localized osteoarthritis of right knee. Patient has severe unremitting pain that affects sleep, daily activities, and work/hobbies. After pre-op clearance the patient was taken to the operating room on 04/26/2018 and underwent  Procedure(s): TOTAL KNEE ARTHROPLASTY.    Patient was given perioperative antibiotics:  Anti-infectives (From admission, onward)   Start     Dose/Rate Route Frequency Ordered Stop   04/26/18 1800  ceFAZolin (ANCEF) IVPB 2g/100 mL premix     2 g 200 mL/hr over 30 Minutes Intravenous Every 6 hours 04/26/18 1656 04/27/18 0858   04/26/18 0715  ceFAZolin (ANCEF) IVPB 2g/100 mL premix     2 g 200 mL/hr over 30 Minutes Intravenous On call to O.R. 04/26/18 0708 04/26/18 4742       Patient was given sequential compression  devices, early ambulation, and chemoprophylaxis to prevent DVT.  Patient benefited maximally from hospital stay and there were no complications.    Recent vital signs:  Patient Vitals for the past 24 hrs:  BP Temp Temp src Pulse Resp SpO2  04/27/18 0519 108/64 (!) 97.5 F (36.4 C) Oral (!) 57 17 96 %  04/27/18 0037 123/71 97.8 F (36.6 C) Oral (!) 59 16 95 %  04/26/18 1951 128/72 (!) 96.5 F (35.8 C) Axillary 75 18 95 %  04/26/18 1730 128/83 97.6 F (36.4 C) Oral 70 18 100 %  04/26/18 1639 - 97.7 F (36.5 C) - - - -  04/26/18 1636 - - - 68 19 98 %  04/26/18 1630 - - - 69 (!) 21 99 %  04/26/18 1615 - - - 67 16 98 %  04/26/18 1602 125/77 - - 68 18 96 %  04/26/18 1600 - - - 77 17 97 %  04/26/18 1545 - - - 66 14 97 %  04/26/18 1530 - - - 65 10 98 %  04/26/18 1515 - - - 67 17 97 %  04/26/18 1500 123/70 - - 66 12 98 %  04/26/18 1445 - - - 72 16 98 %  04/26/18 1430 108/67 - - (!) 59 15 100 %  04/26/18 1415 - - - 67 19 99 %  04/26/18 1400 110/62 (!) 97.3 F (36.3 C) - 60 12 98 %  04/26/18 1345 120/70 - - (!) 59 11 98 %  04/26/18 1330 110/74 - - 61 10 100 %  04/26/18  1315 116/65 - - 62 14 98 %     Recent laboratory studies:  Recent Labs    04/27/18 0317  WBC 13.6*  HGB 13.2  HCT 40.8  PLT 198  NA 134*  K 4.0  CL 102  CO2 21*  BUN 18  CREATININE 1.18  GLUCOSE 172*  CALCIUM 8.4*     Discharge Medications:   Allergies as of 04/27/2018      Reactions   Relafen [nabumetone] Hives, Nausea Only, Other (See Comments)   Dizziness,fainting, itching      Medication List    STOP taking these medications   meloxicam 15 MG tablet Commonly known as:  MOBIC     TAKE these medications   aspirin 325 MG EC tablet 1 tab a day for the next 30 days to prevent blood clots   CLEAR EYES OP Place 1 drop into both eyes daily as needed (for dry eyes).   docusate sodium 100 MG capsule Commonly known as:  COLACE 1 tab 2 times a day while on narcotics.  STOOL SOFTENER    gabapentin 300 MG capsule Commonly known as:  NEURONTIN 1 tablet before bed for nerve pain   oxyCODONE 5 MG immediate release tablet Commonly known as:  Oxy IR/ROXICODONE 1 po q 4 hrs prn pain.  Patient had a right total knee replacement on 04/26/2018   polyethylene glycol packet Commonly known as:  MIRALAX / GLYCOLAX 17grams in 6 oz of something to drink twice a day until bowel movement.  LAXITIVE.  Restart if two days since last bowel movement            Discharge Care Instructions  (From admission, onward)         Start     Ordered   04/27/18 0000  Change dressing    Comments:  DO NOT REMOVE BANDAGE OVER SURGICAL INCISION.  [place] WHOLE LEG INCLUDING OVER THE WATERPROOF BANDAGE WITH SOAP AND WATER EVERY DAY.   04/27/18 1241          Diagnostic Studies: No results found.  Disposition: Discharge disposition: 01-Home or Self Care       Discharge Instructions    CPM   Complete by:  As directed    Continuous passive motion machine (CPM):      Use the CPM from 0 to 90 for 6 hours per day.       You may break it up into 2 or 3 sessions per day.      Use CPM for 2 weeks or until you are told to stop.   Call MD / Call 911   Complete by:  As directed    If you experience chest pain or shortness of breath, CALL 911 and be transported to the hospital emergency room.  If you develope a fever above 101 F, pus (white drainage) or increased drainage or redness at the wound, or calf pain, call your surgeon's office.   Change dressing   Complete by:  As directed    DO NOT REMOVE BANDAGE OVER SURGICAL INCISION.  [place] WHOLE LEG INCLUDING OVER THE WATERPROOF BANDAGE WITH SOAP AND WATER EVERY DAY.   Constipation Prevention   Complete by:  As directed    Drink plenty of fluids.  Prune juice may be helpful.  You may use a stool softener, such as Colace (over the counter) 100 mg twice a day.  Use MiraLax (over the counter) for constipation as needed.   Diet - low sodium heart  healthy    Complete by:  As directed    Discharge instructions   Complete by:  As directed    INSTRUCTIONS AFTER JOINT REPLACEMENT   Remove items at home which could result in a fall. This includes throw rugs or furniture in walking pathways ICE to the affected joint every three hours while awake for 30 minutes at a time, for at least the first 3-5 days, and then as needed for pain and swelling.  Continue to use ice for pain and swelling. You may notice swelling that will progress down to the foot and ankle.  This is normal after surgery.  Elevate your leg when you are not up walking on it.   Continue to use the breathing machine you got in the hospital (incentive spirometer) which will help keep your temperature down.  It is common for your temperature to cycle up and down following surgery, especially at night when you are not up moving around and exerting yourself.  The breathing machine keeps your lungs expanded and your temperature down.   DIET:  As you were doing prior to hospitalization, we recommend a well-balanced diet.  DRESSING / WOUND CARE / SHOWERING  Keep the surgical dressing until follow up.  The dressing is water proof, so you can shower without any extra covering.  IF THE DRESSING FALLS OFF or the wound gets wet inside, change the dressing with sterile gauze.  Please use good hand washing techniques before changing the dressing.  Do not use any lotions or creams on the incision until instructed by your surgeon.    ACTIVITY  Increase activity slowly as tolerated, but follow the weight bearing instructions below.   No driving for 6 weeks or until further direction given by your physician.  You cannot drive while taking narcotics.  No lifting or carrying greater than 10 lbs. until further directed by your surgeon. Avoid periods of inactivity such as sitting longer than an hour when not asleep. This helps prevent blood clots.  You may return to work once you are authorized by your doctor.      WEIGHT BEARING   Weight bearing as tolerated with assist device (walker, cane, etc) as directed, use it as long as suggested by your surgeon or therapist, typically at least 2-3 weeks.   EXERCISES  Results after joint replacement surgery are often greatly improved when you follow the exercise, range of motion and muscle strengthening exercises prescribed by your doctor. Safety measures are also important to protect the joint from further injury. Any time any of these exercises cause you to have increased pain or swelling, decrease what you are doing until you are comfortable again and then slowly increase them. If you have problems or questions, call your caregiver or physical therapist for advice.   Rehabilitation is important following a joint replacement. After just a few days of immobilization, the muscles of the leg can become weakened and shrink (atrophy).  These exercises are designed to build up the tone and strength of the thigh and leg muscles and to improve motion. Often times heat used for twenty to thirty minutes before working out will loosen up your tissues and help with improving the range of motion but do not use heat for the first two weeks following surgery (sometimes heat can increase post-operative swelling).   These exercises can be done on a training (exercise) mat, on the floor, on a table or on a bed. Use whatever works the best and is most  comfortable for you.    Use music or television while you are exercising so that the exercises are a pleasant break in your day. This will make your life better with the exercises acting as a break in your routine that you can look forward to.   Perform all exercises about fifteen times, three times per day or as directed.  You should exercise both the operative leg and the other leg as well.   Exercises include:  Quad Sets - Tighten up the muscle on the front of the thigh (Quad) and hold for 5-10 seconds.   Straight Leg Raises -  With your knee straight (if you were given a brace, keep it on), lift the leg to 60 degrees, hold for 3 seconds, and slowly lower the leg.  Perform this exercise against resistance later as your leg gets stronger.  Leg Slides: Lying on your back, slowly slide your foot toward your buttocks, bending your knee up off the floor (only go as far as is comfortable). Then slowly slide your foot back down until your leg is flat on the floor again.  Angel Wings: Lying on your back spread your legs to the side as far apart as you can without causing discomfort.  Hamstring Strength:  Lying on your back, push your heel against the floor with your leg straight by tightening up the muscles of your buttocks.  Repeat, but this time bend your knee to a comfortable angle, and push your heel against the floor.  You may put a pillow under the heel to make it more comfortable if necessary.   A rehabilitation program following joint replacement surgery can speed recovery and prevent re-injury in the future due to weakened muscles. Contact your doctor or a physical therapist for more information on knee rehabilitation.    CONSTIPATION  Constipation is defined medically as fewer than three stools per week and severe constipation as less than one stool per week.  Even if you have a regular bowel pattern at home, your normal regimen is likely to be disrupted due to multiple reasons following surgery.  Combination of anesthesia, postoperative narcotics, change in appetite and fluid intake all can affect your bowels.   YOU MUST use at least one of the following options; they are listed in order of increasing strength to get the job done.  They are all available over the counter, and you may need to use some, POSSIBLY even all of these options:    Drink plenty of fluids (prune juice may be helpful) and high fiber foods Colace 100 mg by mouth twice a day  Senokot for constipation as directed and as needed Dulcolax (bisacodyl),  take with full glass of water  Miralax (polyethylene glycol) once or twice a day as needed.  If you have tried all these things and are unable to have a bowel movement in the first 3-4 days after surgery call either your surgeon or your primary doctor.    If you experience loose stools or diarrhea, hold the medications until you stool forms back up.  If your symptoms do not get better within 1 week or if they get worse, check with your doctor.  If you experience "the worst abdominal pain ever" or develop nausea or vomiting, please contact the office immediately for further recommendations for treatment.   ITCHING:  If you experience itching with your medications, try taking only a single pain pill, or even half a pain pill at a time.  You can  also use Benadryl over the counter for itching or also to help with sleep.   TED HOSE STOCKINGS:  Use stockings on both legs until for at least 2 weeks or as directed by physician office. They may be removed at night for sleeping.  MEDICATIONS:  See your medication summary on the "After Visit Summary" that nursing will review with you.  You may have some home medications which will be placed on hold until you complete the course of blood thinner medication.  It is important for you to complete the blood thinner medication as prescribed.  PRECAUTIONS:  If you experience chest pain or shortness of breath - call 911 immediately for transfer to the hospital emergency department.   If you develop a fever greater that 101 F, purulent drainage from wound, increased redness or drainage from wound, foul odor from the wound/dressing, or calf pain - CONTACT YOUR SURGEON.                                                   FOLLOW-UP APPOINTMENTS:  If you do not already have a post-op appointment, please call the office for an appointment to be seen by your surgeon.  Guidelines for how soon to be seen are listed in your "After Visit Summary", but are typically between 1-4  weeks after surgery.  OTHER INSTRUCTIONS:   Knee Replacement:  Do not place pillow under knee, focus on keeping the knee straight while resting. CPM instructions: 0-90 degrees, 2 hours in the morning, 2 hours in the afternoon, and 2 hours in the evening. Place foam block, curve side up under heel at all times except when in CPM or when walking.  DO NOT modify, tear, cut, or change the foam block in any way.  MAKE SURE YOU:  Understand these instructions.  Get help right away if you are not doing well or get worse.    Thank you for letting us be a part of your medical care team.  It is a privilege we respect greatly.  We hope these instructions will help you stay on track for a fast and full recovery!   Do not put a pillow under the knee. Place it under the heel.   Complete by:  As directed    Place gray foam block, curve side up under heel at all times except when in CPM or when walking.  DO NOT modify, tear, cut, or change in any way the gray foam block.   Increase activity slowly as tolerated   Complete by:  As directed    Patient may shower   Complete by:  As directed    Aquacel dressing is water proof    Wash over it and the whole leg with soap and water at the end of your shower   TED hose   Complete by:  As directed    Use stockings (TED hose) for 2 weeks on both leg(s).  You may remove them at night for sleeping.      Follow-up Information    [facility] Follow up.   Specialty:  [employer] Why:  A representative from [facility] will contact you to arrange start date and time for your therapy. Contact information: [address] [place] [address] [phone]        Noemi Chapel,  Herbie Baltimore, MD Follow up on 05/11/2018.   Specialty:  Orthopedic Surgery Why:  Physical therapy at [facility] next to Dr Archie Endo office appointment arrive at 1:45 for a 2 pm appointment then you have an appointment in [place] 3 pm with Dr Leotis Pain  information: [address] [place] [address] [phone]            Signed: Linda Hedges 04/27/2018, 1:14 PM

## 2018-04-27 NOTE — Progress Notes (Signed)
Physical Therapy Treatment Patient Details Name: Dustin Jacobson MRN: [id number] DOB: 05/05/1946 Today's Date: 04/27/2018    History of Present Illness Pt is a 72 y/o male s/p elective R TKA. PMH includes schatzki ring and L TKA.     PT Comments    Pt progressing well with mobility. Ambulated 150 feet with RW supervision. Knee immobilizer removed due to pt able to perform SLR without lag. LE exercises performed in recliner with feet elevated. Stair training to be completed in PM session.    Follow Up Recommendations  Follow surgeon's recommendation for DC plan and follow-up therapies;Supervision for mobility/OOB     Equipment Recommendations  None recommended by PT    Recommendations for Other Services       Precautions / Restrictions Precautions Precautions: Knee Precaution Comments: Reviewed knee precautions with pt.  Restrictions Weight Bearing Restrictions: Yes RLE Weight Bearing: Weight bearing as tolerated Other Position/Activity Restrictions: d/c'd KI 12/3 as pt able to complete SLR with no knee lag.    Mobility  Bed Mobility Overal bed mobility: Modified Independent             General bed mobility comments: increased time and effort, no physical assist, no rail  Transfers Overall transfer level: Needs assistance Equipment used: Rolling walker (2 wheeled) Transfers: Sit to/from Stand Sit to Stand: Supervision         General transfer comment: supervision for safety  Ambulation/Gait Ambulation/Gait assistance: Supervision Gait Distance (Feet): 150 Feet Assistive device: Rolling walker (2 wheeled) Gait Pattern/deviations: Step-through pattern;Decreased stride length;Antalgic;Decreased weight shift to right Gait velocity: Decreased  Gait velocity interpretation: 1.31 - 2.62 ft/sec, indicative of limited community ambulator General Gait Details: cues for heel strike/toe off   Stairs             Wheelchair Mobility    Modified Rankin (Stroke  Patients Only)       Balance Overall balance assessment: Needs assistance Sitting-balance support: No upper extremity supported;Feet supported Sitting balance-Leahy Scale: Good     Standing balance support: Bilateral upper extremity supported;During functional activity Standing balance-Leahy Scale: Fair                              Cognition Arousal/Alertness: Awake/alert Behavior During Therapy: WFL for tasks assessed/performed Overall Cognitive Status: Within Functional Limits for tasks assessed                                        Exercises Total Joint Exercises Ankle Circles/Pumps: AROM;Both;20 reps Quad Sets: AROM;Both;10 reps Short Arc Quad: AROM;Right;10 reps Heel Slides: AROM;Right;10 reps Hip ABduction/ADduction: AROM;Right;10 reps Straight Leg Raises: AROM;Right;10 reps Goniometric ROM: 5-95 R knee    General Comments General comments (skin integrity, edema, etc.): Pt's wife present during session.      Pertinent Vitals/Pain Pain Assessment: Faces Faces Pain Scale: Hurts a little bit Pain Location: R knee  Pain Descriptors / Indicators: Grimacing;Guarding Pain Intervention(s): Monitored during session;Repositioned;Ice applied;Premedicated before session    Home Living                      Prior Function            PT Goals (current goals can now be found in the care plan section) Acute Rehab PT Goals Patient Stated Goal: to go home  PT Goal Formulation: With  patient Time For Goal Achievement: 05/10/18 Potential to Achieve Goals: Good Progress towards PT goals: Progressing toward goals    Frequency    7X/week      PT Plan Current plan remains appropriate    Co-evaluation              AM-PAC PT "6 Clicks" Mobility   Outcome Measure  Help needed turning from your back to your side while in a flat bed without using bedrails?: None Help needed moving from lying on your back to sitting on the side  of a flat bed without using bedrails?: None Help needed moving to and from a bed to a chair (including a wheelchair)?: A Little Help needed standing up from a chair using your arms (e.g., wheelchair or bedside chair)?: A Little Help needed to walk in hospital room?: A Little Help needed climbing 3-5 steps with a railing? : A Little 6 Click Score: 20    End of Session Equipment Utilized During Treatment: Gait belt Activity Tolerance: Patient tolerated treatment well Patient left: in chair;with call bell/phone within reach;with family/visitor present Nurse Communication: Mobility status PT Visit Diagnosis: Other abnormalities of gait and mobility (R26.89);Pain Pain - Right/Left: Right Pain - part of body: Knee     Time: 0926-0955 PT Time Calculation (min) (ACUTE ONLY): 29 min  Charges:  $Gait Training: 8-22 mins $Therapeutic Exercise: 8-22 mins                     Lorrin Goodell, PT  Office # [phone] Pager [phone]    Lorriane Shire 04/27/2018, 10:57 AM

## 2018-04-27 NOTE — Plan of Care (Signed)
  Problem: Pain Managment: Goal: General experience of comfort will improve Outcome: Progressing   Problem: Safety: Goal: Ability to remain free from injury will improve Outcome: Progressing   

## 2018-04-27 NOTE — Progress Notes (Signed)
Discharge instructions completed with pt.  Pt verbalized understanding of the information.  Pt denies chest pain, shortness of breath, dizziness, lightheadedness, and n/v.  Pt's IV discontinued.  Pt discharged home per orders.

## 2018-07-15 ENCOUNTER — Ambulatory Visit (INDEPENDENT_AMBULATORY_CARE_PROVIDER_SITE_OTHER): Payer: Medicare Other | Admitting: Otolaryngology

## 2018-08-10 ENCOUNTER — Ambulatory Visit: Payer: Medicare Other | Admitting: Internal Medicine

## 2018-08-12 IMAGING — US US ASPIRATION
1 series · 8 of 8 positions shown · non-contrast
Comparison: none

INDICATION: Right knee Baker's cyst.

EXAM:
US ASPIRATION
MEDICATIONS:
The patient is currently admitted to the hospital and receiving
intravenous antibiotics. The antibiotics were administered within an
appropriate time frame prior to the initiation of the procedure.
ANESTHESIA/SEDATION:
None.
COMPLICATIONS:
None immediate.
TECHNIQUE: Informed written consent was obtained from the patient after a
thorough discussion of the procedural risks, benefits and
alternatives. All questions were addressed. Maximal Sterile Barrier
Technique was utilized including caps, mask, sterile gowns, sterile
gloves, sterile drape, hand hygiene and skin antiseptic. A timeout
was performed prior to the initiation of the procedure.

[Series 1: us aspiration · 0.07mm/px · 8 acquisitions, 8 frames shown]
[im 1/8]
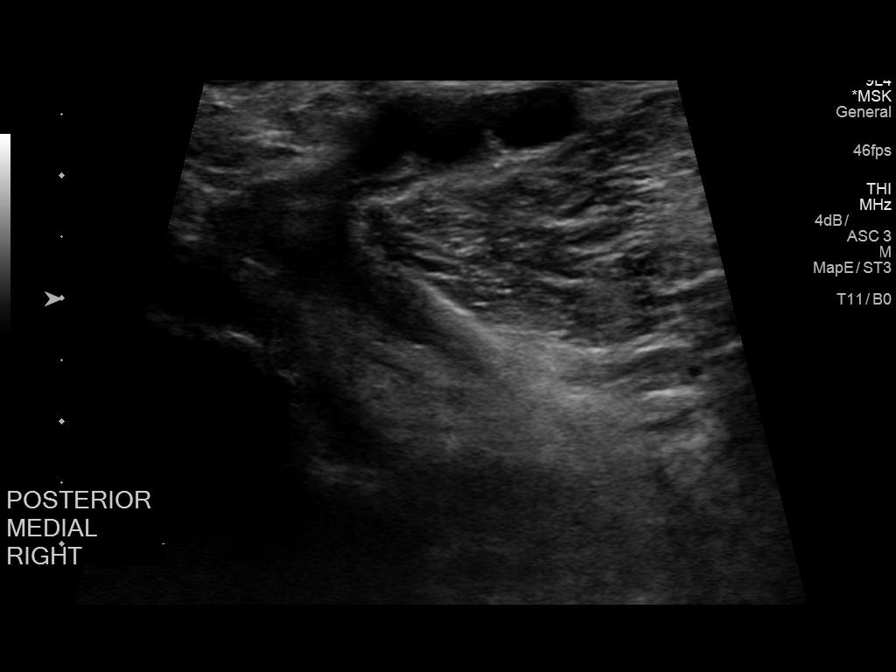
[im 2/8]
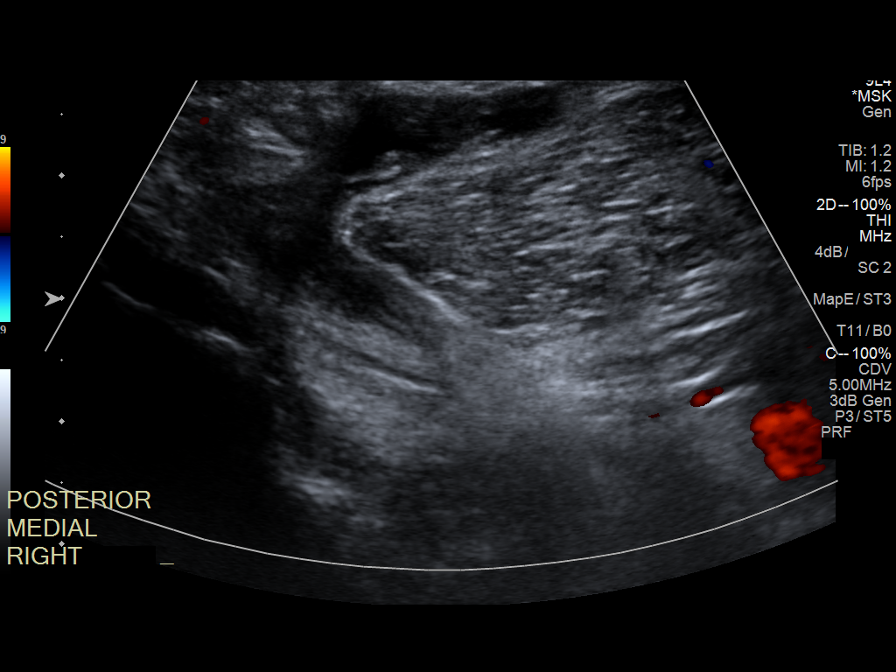
[im 3/8]
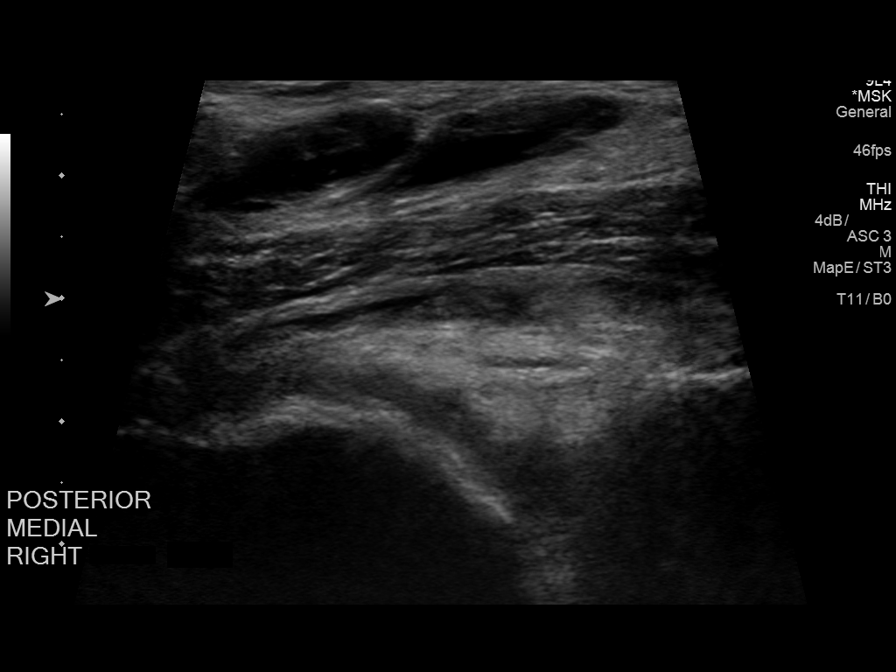
[im 4/8]
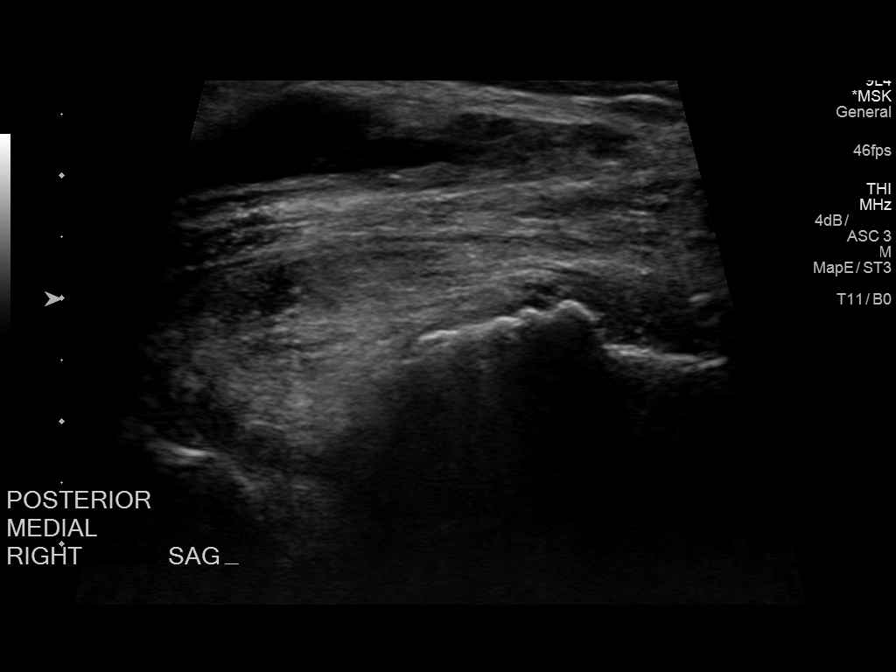
[im 5/8]
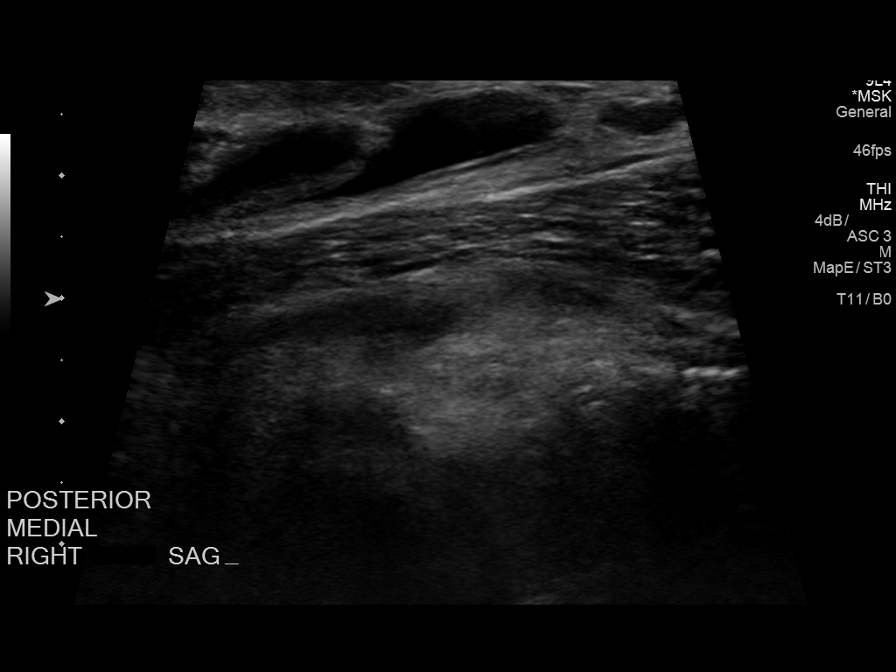
[im 6/8]
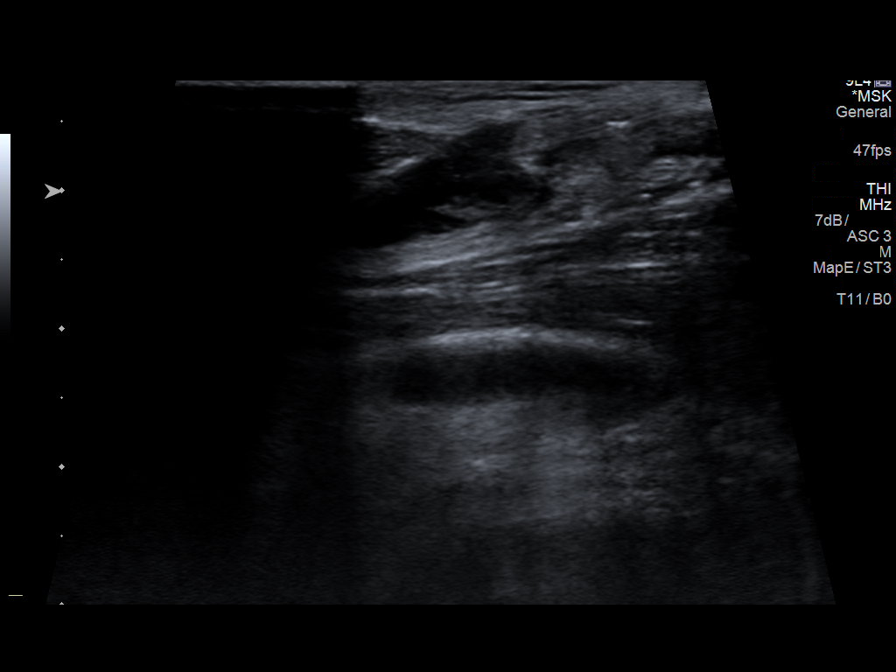
[im 7/8]
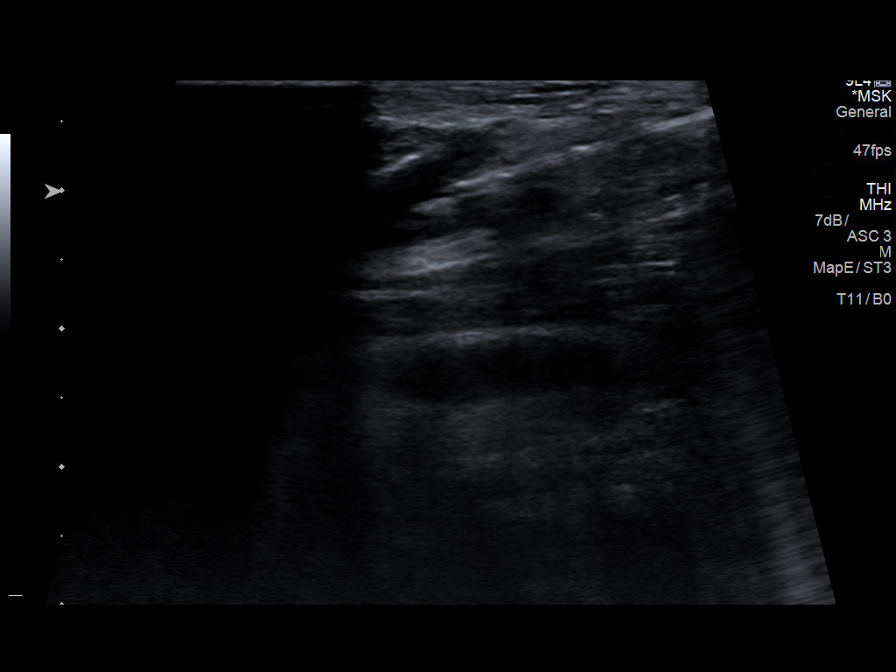
[im 8/8]
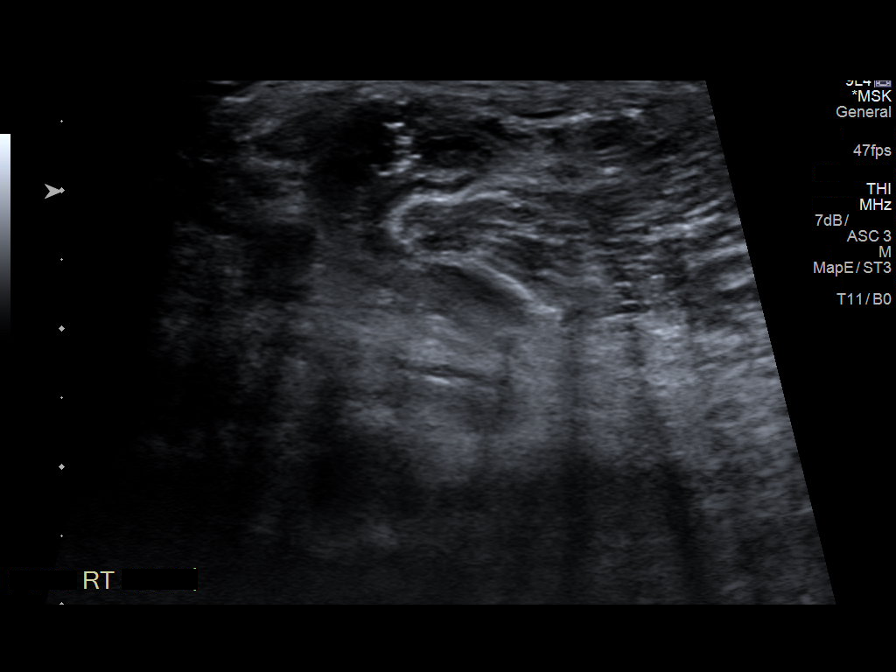

[8 of 8 positions shown; findings below may reference images not displayed]

Survey ultrasound of the right popliteal fossa was performed and a
medial posterior elongated fluid collection was identified.
Overlying skin prepped with Betadine, draped in usual sterile
fashion, infiltrated locally with 1% lidocaine. A Yeuh Angiocath was
then inserted into the fluid collection under sonographic guidance.
3 cc yellow fluid was aspirated. 2 cc 1% lidocaine mixed with 120 mg
Depo-Medrol were then administered into the collection. Patient
tolerated procedure and was discharged home in good condition.
IMPRESSION: Technically successful aspiration and injection of right knee
Baker's cyst.

## 2019-09-12 DIAGNOSIS — L570 Actinic keratosis: Secondary | ICD-10-CM | POA: Diagnosis not present

## 2019-09-12 DIAGNOSIS — D485 Neoplasm of uncertain behavior of skin: Secondary | ICD-10-CM | POA: Diagnosis not present

## 2019-09-12 DIAGNOSIS — C44319 Basal cell carcinoma of skin of other parts of face: Secondary | ICD-10-CM | POA: Diagnosis not present

## 2019-11-25 DIAGNOSIS — L905 Scar conditions and fibrosis of skin: Secondary | ICD-10-CM | POA: Diagnosis not present

## 2019-11-25 DIAGNOSIS — C44319 Basal cell carcinoma of skin of other parts of face: Secondary | ICD-10-CM | POA: Diagnosis not present

## 2019-12-06 DIAGNOSIS — C44319 Basal cell carcinoma of skin of other parts of face: Secondary | ICD-10-CM | POA: Diagnosis not present

## 2019-12-30 ENCOUNTER — Ambulatory Visit
Admission: EM | Admit: 2019-12-30 | Discharge: 2019-12-30 | Disposition: A | Discharge status: Home / Self Care | Payer: Medicare PPO | Attending: Emergency Medicine | Admitting: Emergency Medicine

## 2019-12-30 DIAGNOSIS — N200 Calculus of kidney: Secondary | ICD-10-CM | POA: Diagnosis not present

## 2019-12-30 DIAGNOSIS — Z87442 Personal history of urinary calculi: Secondary | ICD-10-CM

## 2019-12-30 DIAGNOSIS — K219 Gastro-esophageal reflux disease without esophagitis: Secondary | ICD-10-CM | POA: Diagnosis not present

## 2019-12-30 DIAGNOSIS — R109 Unspecified abdominal pain: Secondary | ICD-10-CM | POA: Diagnosis not present

## 2019-12-30 DIAGNOSIS — E663 Overweight: Secondary | ICD-10-CM | POA: Diagnosis not present

## 2019-12-30 DIAGNOSIS — Z6825 Body mass index (BMI) 25.0-25.9, adult: Secondary | ICD-10-CM | POA: Diagnosis not present

## 2019-12-30 DIAGNOSIS — Z1389 Encounter for screening for other disorder: Secondary | ICD-10-CM | POA: Diagnosis not present

## 2019-12-30 LAB — POCT URINALYSIS DIP (MANUAL ENTRY)
Bilirubin, UA: NEGATIVE
Blood, UA: NEGATIVE
Glucose, UA: NEGATIVE mg/dL
Ketones, POC UA: NEGATIVE mg/dL
Leukocytes, UA: NEGATIVE
Nitrite, UA: NEGATIVE
Protein Ur, POC: NEGATIVE mg/dL
Spec Grav, UA: <=1.005 — AB (ref 1.010–1.025)
Urobilinogen, UA: 0.2 E.U./dL
pH, UA: 5 (ref 5.0–8.0)

## 2019-12-30 MED ORDER — KETOROLAC TROMETHAMINE 30 MG/ML IJ SOLN
30.0000 mg | Freq: Once | INTRAMUSCULAR | Status: AC
  Administered 2019-12-30: 30 mg via INTRAMUSCULAR

## 2019-12-30 MED ORDER — TAMSULOSIN HCL 0.4 MG PO CAPS: 0.4000 mg | ORAL_CAPSULE | Freq: Every day | ORAL | 0 refills | Status: DC

## 2019-12-30 NOTE — Discharge Instructions (Signed)
Toradol shot given in office Drink plenty of fluids and get rest Use flomax as directed  Use OTC ibuprofen as needed for pain.   Use oxycodone as prescribed.   Follow up with PCP if symptoms persist Return or go to ER if you have any new or worsening symptoms (difficulty urinating, blood in urine, pain that does not moderate with medication, fever, chills, abdominal pain, etc...)

## 2019-12-30 NOTE — ED Provider Notes (Addendum)
[facility]   CC: RT flank pain  SUBJECTIVE:  Dustin Jacobson is a 74 y.o. male who complains of RT sided flank pain x 1 day.  Patient denies a precipitating event, or trauma.  Localizes the pain to the RT flank.  Pain is constant and describes it as "10"/10.  Saw PCP earlier today, given a shot of toradol and oxycodone without relief.   Symptoms are made worse with urination.  Admits to similar symptoms in the past.  Has had lithotripsy in the past.  Denies fever, chills, nausea, vomiting, abdominal pain, decreased urine output, dysuria, urinary frequency, urinary incontinence.    LMP: No LMP for male patient.  ROS: As in HPI.  All other pertinent ROS negative.     Past Medical History:  Diagnosis Date  . Adenomatous colon polyp 2002  . Early cataract    left  . GERD (gastroesophageal reflux disease)   . History of kidney stones   . Primary localized osteoarthritis of left knee   . Primary localized osteoarthritis of left knee   . Primary localized osteoarthritis of right knee 04/14/2018  . S/P colonoscopy 05/15/2006   Dr Laurine Blazer anal canal hemorrhoids, otherwise normal  . S/P TKR (total knee replacement), left 04/14/2018  . Schatzki's ring 05/15/2006   Last EGD Dr Rourk-> dilated 15F, small hiatal hernia   Past Surgical History:  Procedure Laterality Date  . BUNIONECTOMY    . COLONOSCOPY  04/28/2011   Dr. Gala Romney: colonic diverticulosis, single diminutive polyp (tubular adenoma), 5 year surveillance  . COLONOSCOPY N/A 02/18/2017   Procedure: COLONOSCOPY;  Surgeon: Daneil Dolin, MD;  [facility];  Service: Endoscopy;  Laterality: N/A;  1:45pm  . ESOPHAGOGASTRODUODENOSCOPY  2012   Dr. Gala Romney: erosive reflux esophagitis, Schatzki's ring s/p dilation, H.pylori gastritis  . ESOPHAGOGASTRODUODENOSCOPY N/A 02/18/2017   Procedure: ESOPHAGOGASTRODUODENOSCOPY (EGD);  Surgeon: Daneil Dolin, MD;  [facility];  Service: Endoscopy;  Laterality: N/A;   . FINGER SURGERY Left    middle finger  . INGUINAL HERNIA REPAIR     bilat  . KNEE SURGERY     bilat  . LITHOTRIPSY     x2  . MALONEY DILATION N/A 02/18/2017   Procedure: Venia Minks DILATION;  Surgeon: Daneil Dolin, MD;  [facility];  Service: Endoscopy;  Laterality: N/A;  . TOTAL KNEE ARTHROPLASTY Left 07/28/2016   Procedure: TOTAL KNEE ARTHROPLASTY;  Surgeon: Elsie Saas, MD;  Location: [place];  Service: Orthopedics;  Laterality: Left;  . TOTAL KNEE ARTHROPLASTY Right 04/26/2018   Procedure: TOTAL KNEE ARTHROPLASTY;  Surgeon: Elsie Saas, MD;  Location: [place];  Service: Orthopedics;  Laterality: Right;  . UMBILICAL HERNIA REPAIR     Allergies  Allergen Reactions  . Relafen [Nabumetone] Hives, Nausea Only and Other (See Comments)    Dizziness,fainting, itching    No current facility-administered medications on file prior to encounter.   Current Outpatient Medications on File Prior to Encounter  Medication Sig Dispense Refill  . oxyCODONE (OXY IR/ROXICODONE) 5 MG immediate release tablet 1 po q 4 hrs prn pain.  Patient had a right total knee replacement on 04/26/2018 42 tablet 0  . aspirin EC 325 MG EC tablet 1 tab a day for the next 30 days to prevent blood clots 30 tablet 0  . docusate sodium (COLACE) 100 MG capsule 1 tab 2 times a day while on narcotics.  STOOL SOFTENER 60 capsule 0  . Naphazoline HCl (CLEAR EYES OP)  Place 1 drop into both eyes daily as needed (for dry eyes).    . polyethylene glycol (MIRALAX / GLYCOLAX) packet 17grams in 6 oz of something to drink twice a day until bowel movement.  LAXITIVE.  Restart if two days since last bowel movement 14 each 0  . [DISCONTINUED] gabapentin (NEURONTIN) 300 MG capsule 1 tablet before bed for nerve pain 30 capsule 0   Social History   Socioeconomic History  . Marital status: Married    Spouse name: Not on file  . Number of children: 2  . Years of education: Not on file  . Highest education level: Not on file    Occupational History  . Occupation: retired; Pharmacist, hospital  Tobacco Use  . Smoking status: Current Every Day Smoker    Packs/day: 0.00  . Smokeless tobacco: Never Used  . Tobacco comment: quit about 40 yrs ago  Vaping Use  . Vaping Use: Never used  Substance and Sexual Activity  . Alcohol use: Yes    Comment: once every one to two months  . Drug use: No  . Sexual activity: Yes    Birth control/protection: None  Other Topics Concern  . Not on file  Social History Narrative   Lives w/ wife         Social Determinants of Health   Financial Resource Strain:   . Difficulty of Paying Living Expenses:   Food Insecurity:   . Worried About Charity fundraiser in the Last Year:   . Arboriculturist in the Last Year:   Transportation Needs:   . Film/video editor (Medical):   Marland Kitchen Lack of Transportation (Non-Medical):   Physical Activity:   . Days of Exercise per Week:   . Minutes of Exercise per Session:   Stress:   . Feeling of Stress :   Social Connections:   . Frequency of Communication with Friends and Family:   . Frequency of Social Gatherings with Friends and Family:   . Attends Religious Services:   . Active Member of Clubs or Organizations:   . Attends Archivist Meetings:   Marland Kitchen Marital Status:   Intimate Partner Violence:   . Fear of Current or Ex-Partner:   . Emotionally Abused:   Marland Kitchen Physically Abused:   . Sexually Abused:    Family History  Problem Relation Age of Onset  . Heart failure Mother   . Colon polyps Mother   . Coronary artery disease Father   . Lung cancer Brother   . Anesthesia problems Neg Hx   . Hypotension Neg Hx   . Malignant hyperthermia Neg Hx   . Pseudochol deficiency Neg Hx   . Colon cancer Neg Hx     OBJECTIVE:  Vitals:   12/30/19 1856  BP: (!) 169/89  Pulse: (!) 52  Resp: 20  Temp: (!) 97.5 F (36.4 C)  SpO2: 98%   General appearance: Alert; appears uncomfortable, standing upon entering room HEENT: NCAT.  Oropharynx  clear.  Lungs: clear to auscultation bilaterally without adventitious breath sounds Heart: regular rate and rhythm.   Abdomen: soft; non-distended; no tenderness; bowel sounds present; no guarding Back: + RT sided CVA tenderness Extremities: no edema; symmetrical with no gross deformities Skin: warm and dry Neurologic: Ambulates from chair to exam table without difficulty Psychological: alert and cooperative; normal mood and affect  Labs Reviewed  POCT URINALYSIS DIP (MANUAL ENTRY) - Abnormal; Notable for the following components:      Result Value  Spec Grav, UA <=1.005 (*)    All other components within normal limits    ASSESSMENT & PLAN:  1. Left flank pain   2. History of kidney stones     Meds ordered this encounter  Medications  . tamsulosin (FLOMAX) 0.4 MG CAPS capsule    Sig: Take 1 capsule (0.4 mg total) by mouth daily.    Dispense:  30 capsule    Refill:  0    Order Specific Question:   Supervising Provider    Answer:   Raylene Everts [[id number]]  . ketorolac (TORADOL) 30 MG/ML injection 30 mg   Toradol shot given in office Drink plenty of fluids and get rest Use flomax as directed  Use OTC ibuprofen as needed for pain.   Use oxycodone as prescribed.   Follow up with PCP if symptoms persist Return or go to ER if you have any new or worsening symptoms (difficulty urinating, blood in urine, pain that does not moderate with medication, fever, chills, abdominal pain, etc...)    Outlined signs and symptoms indicating need for more acute intervention. Patient verbalized understanding. After Visit Summary given.     Lestine Box, PA-C 12/30/19 [place], [place], PA-C 12/30/19 1919

## 2019-12-30 NOTE — ED Triage Notes (Signed)
Pt presents with c/o left flank pain that began today , has h/o kidney stones

## 2020-01-10 DIAGNOSIS — N200 Calculus of kidney: Secondary | ICD-10-CM | POA: Diagnosis not present

## 2020-01-10 DIAGNOSIS — K5730 Diverticulosis of large intestine without perforation or abscess without bleeding: Secondary | ICD-10-CM | POA: Diagnosis not present

## 2020-01-10 DIAGNOSIS — K449 Diaphragmatic hernia without obstruction or gangrene: Secondary | ICD-10-CM | POA: Diagnosis not present

## 2020-02-23 DIAGNOSIS — E7849 Other hyperlipidemia: Secondary | ICD-10-CM | POA: Diagnosis not present

## 2020-02-23 DIAGNOSIS — K219 Gastro-esophageal reflux disease without esophagitis: Secondary | ICD-10-CM | POA: Diagnosis not present

## 2020-02-23 DIAGNOSIS — M47816 Spondylosis without myelopathy or radiculopathy, lumbar region: Secondary | ICD-10-CM | POA: Diagnosis not present

## 2020-02-23 DIAGNOSIS — G8929 Other chronic pain: Secondary | ICD-10-CM | POA: Diagnosis not present

## 2020-02-23 DIAGNOSIS — M5410 Radiculopathy, site unspecified: Secondary | ICD-10-CM | POA: Diagnosis not present

## 2020-02-23 DIAGNOSIS — Z6824 Body mass index (BMI) 24.0-24.9, adult: Secondary | ICD-10-CM | POA: Diagnosis not present

## 2020-02-23 DIAGNOSIS — M5416 Radiculopathy, lumbar region: Secondary | ICD-10-CM | POA: Diagnosis not present

## 2020-03-08 DIAGNOSIS — J069 Acute upper respiratory infection, unspecified: Secondary | ICD-10-CM | POA: Diagnosis not present

## 2020-03-13 DIAGNOSIS — D045 Carcinoma in situ of skin of trunk: Secondary | ICD-10-CM | POA: Diagnosis not present

## 2020-03-13 DIAGNOSIS — D0461 Carcinoma in situ of skin of right upper limb, including shoulder: Secondary | ICD-10-CM | POA: Diagnosis not present

## 2020-03-13 DIAGNOSIS — L570 Actinic keratosis: Secondary | ICD-10-CM | POA: Diagnosis not present

## 2020-03-13 DIAGNOSIS — D485 Neoplasm of uncertain behavior of skin: Secondary | ICD-10-CM | POA: Diagnosis not present

## 2020-04-05 DIAGNOSIS — C44529 Squamous cell carcinoma of skin of other part of trunk: Secondary | ICD-10-CM | POA: Diagnosis not present

## 2020-05-30 DIAGNOSIS — Z1331 Encounter for screening for depression: Secondary | ICD-10-CM | POA: Diagnosis not present

## 2020-05-30 DIAGNOSIS — E7849 Other hyperlipidemia: Secondary | ICD-10-CM | POA: Diagnosis not present

## 2020-05-30 DIAGNOSIS — E663 Overweight: Secondary | ICD-10-CM | POA: Diagnosis not present

## 2020-05-30 DIAGNOSIS — Z0000 Encounter for general adult medical examination without abnormal findings: Secondary | ICD-10-CM | POA: Diagnosis not present

## 2020-05-30 DIAGNOSIS — R7309 Other abnormal glucose: Secondary | ICD-10-CM | POA: Diagnosis not present

## 2020-05-30 DIAGNOSIS — Z6825 Body mass index (BMI) 25.0-25.9, adult: Secondary | ICD-10-CM | POA: Diagnosis not present

## 2020-05-30 DIAGNOSIS — Z0001 Encounter for general adult medical examination with abnormal findings: Secondary | ICD-10-CM | POA: Diagnosis not present

## 2020-05-30 DIAGNOSIS — Z1389 Encounter for screening for other disorder: Secondary | ICD-10-CM | POA: Diagnosis not present

## 2020-06-18 DIAGNOSIS — H524 Presbyopia: Secondary | ICD-10-CM | POA: Diagnosis not present

## 2020-07-11 DIAGNOSIS — H9201 Otalgia, right ear: Secondary | ICD-10-CM | POA: Diagnosis not present

## 2020-07-11 DIAGNOSIS — R040 Epistaxis: Secondary | ICD-10-CM | POA: Diagnosis not present

## 2020-07-17 DIAGNOSIS — S46211D Strain of muscle, fascia and tendon of other parts of biceps, right arm, subsequent encounter: Secondary | ICD-10-CM | POA: Diagnosis not present

## 2020-08-09 DIAGNOSIS — R040 Epistaxis: Secondary | ICD-10-CM | POA: Diagnosis not present

## 2020-08-09 DIAGNOSIS — H903 Sensorineural hearing loss, bilateral: Secondary | ICD-10-CM | POA: Diagnosis not present

## 2020-09-11 DIAGNOSIS — L570 Actinic keratosis: Secondary | ICD-10-CM | POA: Diagnosis not present

## 2020-09-21 DIAGNOSIS — H903 Sensorineural hearing loss, bilateral: Secondary | ICD-10-CM | POA: Diagnosis not present

## 2021-02-19 DIAGNOSIS — K222 Esophageal obstruction: Secondary | ICD-10-CM | POA: Diagnosis not present

## 2021-02-19 DIAGNOSIS — Z85828 Personal history of other malignant neoplasm of skin: Secondary | ICD-10-CM | POA: Diagnosis not present

## 2021-02-19 DIAGNOSIS — R030 Elevated blood-pressure reading, without diagnosis of hypertension: Secondary | ICD-10-CM | POA: Diagnosis not present

## 2021-03-13 DIAGNOSIS — L570 Actinic keratosis: Secondary | ICD-10-CM | POA: Diagnosis not present

## 2021-03-13 DIAGNOSIS — D489 Neoplasm of uncertain behavior, unspecified: Secondary | ICD-10-CM | POA: Diagnosis not present

## 2021-03-13 DIAGNOSIS — C44722 Squamous cell carcinoma of skin of right lower limb, including hip: Secondary | ICD-10-CM | POA: Diagnosis not present

## 2021-03-21 DIAGNOSIS — C44722 Squamous cell carcinoma of skin of right lower limb, including hip: Secondary | ICD-10-CM | POA: Diagnosis not present

## 2021-05-12 ENCOUNTER — Other Ambulatory Visit: Payer: Self-pay

## 2021-05-12 ENCOUNTER — Ambulatory Visit
Admission: EM | Admit: 2021-05-12 | Discharge: 2021-05-12 | Disposition: A | Discharge status: Home / Self Care | Payer: Medicare PPO | Attending: Family Medicine | Admitting: Family Medicine

## 2021-05-12 ENCOUNTER — Telehealth: Payer: Self-pay | Admitting: Family Medicine

## 2021-05-12 DIAGNOSIS — Z20822 Contact with and (suspected) exposure to covid-19: Secondary | ICD-10-CM

## 2021-05-12 DIAGNOSIS — U071 COVID-19: Secondary | ICD-10-CM | POA: Diagnosis not present

## 2021-05-12 MED ORDER — PROMETHAZINE-DM 6.25-15 MG/5ML PO SYRP: 5.0000 mL | ORAL_SOLUTION | Freq: Four times a day (QID) | ORAL | 0 refills | Status: DC | PRN

## 2021-05-12 MED ORDER — MOLNUPIRAVIR EUA 200MG CAPSULE: 4.0000 | ORAL_CAPSULE | Freq: Two times a day (BID) | ORAL | 0 refills | Status: DC

## 2021-05-12 MED ORDER — MOLNUPIRAVIR EUA 200MG CAPSULE: 4.0000 | ORAL_CAPSULE | Freq: Two times a day (BID) | ORAL | 0 refills | Status: AC

## 2021-05-12 NOTE — ED Triage Notes (Signed)
Patient states he has had a fever. He states his skin is sore to the touch starting yesterday.   Patient states he took a Covid test this morning and it was positive. He states the test may have been old so he would like to be tested today.   Denies Fever today.

## 2021-05-12 NOTE — ED Provider Notes (Signed)
[facility]    CSN: [id number] Arrival date & time: 05/12/21  0932      History   Chief Complaint Chief Complaint  Patient presents with   Cough    Cough and fever    HPI Dustin Jacobson is a 75 y.o. male.   Presenting today with 1 day history of fever, chills, body aches, fatigue, cough.  Denies congestion, sore throat, chest pain, shortness of breath, abdominal pain, nausea vomiting or diarrhea.  So far taking Tylenol and NyQuil with mild temporary relief of symptoms.  Home COVID test was positive last night.  No known history of chronic pulmonary disease.   Past Medical History:  Diagnosis Date   Adenomatous colon polyp 2002   Early cataract    left   GERD (gastroesophageal reflux disease)    History of kidney stones    Primary localized osteoarthritis of left knee    Primary localized osteoarthritis of left knee    Primary localized osteoarthritis of right knee 04/14/2018   S/P colonoscopy 05/15/2006   Dr Laurine Blazer anal canal hemorrhoids, otherwise normal   S/P TKR (total knee replacement), left 04/14/2018   Schatzki's ring 05/15/2006   Last EGD Dr Rourk-> dilated 50F, small hiatal hernia    Patient Active Problem List   Diagnosis Date Noted   Primary localized osteoarthritis of right knee 04/14/2018   S/P TKR (total knee replacement), left 04/14/2018   Primary localized osteoarthritis of left knee    GERD (gastroesophageal reflux disease) 03/31/2011   Adenomatous colon polyp 03/31/2011   Dysphagia 03/31/2011   Schatzki's ring 03/31/2011    Past Surgical History:  Procedure Laterality Date   BUNIONECTOMY     COLONOSCOPY  04/28/2011   Dr. Gala Romney: colonic diverticulosis, single diminutive polyp (tubular adenoma), 5 year surveillance   COLONOSCOPY N/A 02/18/2017   Procedure: COLONOSCOPY;  Surgeon: Daneil Dolin, MD;  [facility];  Service: Endoscopy;  Laterality: N/A;  1:45pm   ESOPHAGOGASTRODUODENOSCOPY  2012   Dr. Gala Romney: erosive  reflux esophagitis, Schatzki's ring s/p dilation, H.pylori gastritis   ESOPHAGOGASTRODUODENOSCOPY N/A 02/18/2017   Procedure: ESOPHAGOGASTRODUODENOSCOPY (EGD);  Surgeon: Daneil Dolin, MD;  [facility];  Service: Endoscopy;  Laterality: N/A;   FINGER SURGERY Left    middle finger   INGUINAL HERNIA REPAIR     bilat   KNEE SURGERY     bilat   LITHOTRIPSY     x2   MALONEY DILATION N/A 02/18/2017   Procedure: MALONEY DILATION;  Surgeon: Daneil Dolin, MD;  [facility];  Service: Endoscopy;  Laterality: N/A;   TOTAL KNEE ARTHROPLASTY Left 07/28/2016   Procedure: TOTAL KNEE ARTHROPLASTY;  Surgeon: Elsie Saas, MD;  Location: [place];  Service: Orthopedics;  Laterality: Left;   TOTAL KNEE ARTHROPLASTY Right 04/26/2018   Procedure: TOTAL KNEE ARTHROPLASTY;  Surgeon: Elsie Saas, MD;  Location: [place];  Service: Orthopedics;  Laterality: Right;   UMBILICAL HERNIA REPAIR         Home Medications    Prior to Admission medications   Medication Sig Start Date End Date Taking? Authorizing Provider  molnupiravir EUA (LAGEVRIO) 200 mg CAPS capsule Take 4 capsules (800 mg total) by mouth 2 (two) times daily for 5 days. 05/12/21 05/17/21 Yes Volney American, PA-C  promethazine-dextromethorphan (PROMETHAZINE-DM) 6.25-15 MG/5ML syrup Take 5 mLs by mouth 4 (four) times daily as needed. 05/12/21  Yes Volney American, PA-C  aspirin EC 325 MG EC tablet 1 tab  a day for the next 30 days to prevent blood clots 04/27/18   Shepperson, Kirstin, PA-C  docusate sodium (COLACE) 100 MG capsule 1 tab 2 times a day while on narcotics.  STOOL SOFTENER 04/27/18   Shepperson, Kirstin, PA-C  Naphazoline HCl (CLEAR EYES OP) Place 1 drop into both eyes daily as needed (for dry eyes).    [provider]  oxyCODONE (OXY IR/ROXICODONE) 5 MG immediate release tablet 1 po q 4 hrs prn pain.  Patient had a right total knee replacement on 04/26/2018 04/27/18   Shepperson, Kirstin, PA-C   polyethylene glycol (MIRALAX / GLYCOLAX) packet 17grams in 6 oz of something to drink twice a day until bowel movement.  LAXITIVE.  Restart if two days since last bowel movement 04/27/18   Shepperson, Kirstin, PA-C  tamsulosin (FLOMAX) 0.4 MG CAPS capsule Take 1 capsule (0.4 mg total) by mouth daily. 12/30/19   W[place], [place], PA-C  gabapentin (NEURONTIN) 300 MG capsule 1 tablet before bed for nerve pain 04/27/18 12/30/19  Shepperson, Dellis Anes, PA-C    Family History Family History  Problem Relation Age of Onset   Heart failure Mother    Colon polyps Mother    Coronary artery disease Father    Lung cancer Brother    Anesthesia problems Neg Hx    Hypotension Neg Hx    Malignant hyperthermia Neg Hx    Pseudochol deficiency Neg Hx    Colon cancer Neg Hx     Social History Social History   Tobacco Use   Smoking status: Former    Packs/day: 0.00    Types: Cigarettes   Smokeless tobacco: Never   Tobacco comments:    quit about 40 yrs ago  Vaping Use   Vaping Use: Never used  Substance Use Topics   Alcohol use: Yes    Comment: once every one to two months   Drug use: No     Allergies   Relafen [nabumetone]   Review of Systems Review of Systems Per HPI  Physical Exam Triage Vital Signs ED Triage Vitals  Enc Vitals Group     BP 05/12/21 1134 112/74     Pulse Rate 05/12/21 1134 85     Resp 05/12/21 1134 18     Temp 05/12/21 1134 100.1 F (37.8 C)     Temp Source 05/12/21 1134 Oral     SpO2 05/12/21 1134 97 %     Weight --      Height --      Head Circumference --      Peak Flow --      Pain Score 05/12/21 1131 5     Pain Loc --      Pain Edu? --      Excl. in [place]? --    No data found.  Updated Vital Signs BP 112/74 (BP Location: Right Arm)    Pulse 85    Temp 100.1 F (37.8 C) (Oral)    Resp 18    SpO2 97%   Visual Acuity Right Eye Distance:   Left Eye Distance:   Bilateral Distance:    Right Eye Near:   Left Eye Near:    Bilateral Near:     Physical  Exam Vitals and nursing note reviewed.  Constitutional:      Appearance: He is well-developed.  HENT:     Head: Atraumatic.     Right Ear: External ear normal.     Left Ear: External ear normal.  Nose: Nose normal.     Mouth/Throat:     Pharynx: No oropharyngeal exudate.  Eyes:     Conjunctiva/sclera: Conjunctivae normal.     Pupils: Pupils are equal, round, and reactive to light.  Cardiovascular:     Rate and Rhythm: Normal rate and regular rhythm.  Pulmonary:     Effort: Pulmonary effort is normal. No respiratory distress.     Breath sounds: Wheezing present. No rales.  Musculoskeletal:        General: Normal range of motion.     Cervical back: Normal range of motion and neck supple.  Lymphadenopathy:     Cervical: No cervical adenopathy.  Skin:    General: Skin is warm and dry.  Neurological:     Mental Status: He is alert and oriented to person, place, and time.  Psychiatric:        Behavior: Behavior normal.     UC Treatments / Results  Labs (all labs ordered are listed, but only abnormal results are displayed) Labs Reviewed  COVID-19, FLU A+B NAA    EKG   Radiology No results found.  Procedures Procedures (including critical care time)  Medications Ordered in UC Medications - No data to display  Initial Impression / Assessment and Plan / UC Course  I have reviewed the triage vital signs and the nursing notes.  Pertinent labs & imaging results that were available during my care of the patient were reviewed by me and considered in my medical decision making (see chart for details).     Low-grade fever in triage, otherwise vital signs reassuring.  We will start molnupiravir given home positive COVID results and Phenergan DM, over-the-counter supportive medications and home care.  Return for acutely worsening symptoms.  Final Clinical Impressions(s) / UC Diagnoses   Final diagnoses:  Exposure to COVID-19 virus  COVID-19   Discharge Instructions    None    ED Prescriptions     Medication Sig Dispense Auth. Provider   molnupiravir EUA (LAGEVRIO) 200 mg CAPS capsule Take 4 capsules (800 mg total) by mouth 2 (two) times daily for 5 days. 40 capsule Volney American, [place]   promethazine-dextromethorphan (PROMETHAZINE-DM) 6.25-15 MG/5ML syrup Take 5 mLs by mouth 4 (four) times daily as needed. 100 mL Volney American, [place]      PDMP not reviewed this encounter.   Volney American, [place] 05/12/21 1202

## 2021-05-12 NOTE — Telephone Encounter (Signed)
Meds re-sent to [facility] [place]

## 2021-05-13 LAB — COVID-19, FLU A+B NAA
Influenza A, NAA: NOT DETECTED
Influenza B, NAA: NOT DETECTED
SARS-CoV-2, NAA: DETECTED — AB

## 2021-05-22 ENCOUNTER — Ambulatory Visit: Payer: Medicare PPO | Admitting: Podiatry

## 2021-05-22 ENCOUNTER — Other Ambulatory Visit: Payer: Self-pay

## 2021-05-22 ENCOUNTER — Ambulatory Visit (INDEPENDENT_AMBULATORY_CARE_PROVIDER_SITE_OTHER): Payer: Medicare PPO

## 2021-05-22 ENCOUNTER — Encounter: Payer: Self-pay | Admitting: Podiatry

## 2021-05-22 ENCOUNTER — Ambulatory Visit: Payer: Medicare PPO

## 2021-05-22 DIAGNOSIS — M722 Plantar fascial fibromatosis: Secondary | ICD-10-CM

## 2021-05-22 DIAGNOSIS — L600 Ingrowing nail: Secondary | ICD-10-CM

## 2021-05-22 NOTE — Progress Notes (Signed)
Subjective:   Patient ID: Dustin Jacobson, male   DOB: 75 y.o.   MRN: [id number]   HPI Patient presents stating he still has trouble with his right foot especially when trying to wear his ski boot and he has a nail that is become increasingly bothersome for him second toe right and it is painful when pressed and makes shoe gear difficult.  He does have structural deformity of both feet   Review of Systems  All other systems reviewed and are negative.      Objective:  Physical Exam Vitals and nursing note reviewed.  Constitutional:      Appearance: He is well-developed.  Pulmonary:     Effort: Pulmonary effort is normal.  Musculoskeletal:        General: Normal range of motion.  Skin:    General: Skin is warm.  Neurological:     Mental Status: He is alert.    Neurovascular status was found to be intact muscle strength was found to be adequate range of motion adequate.  Patient is noted to have depression of the right arch with prominent navicular and inflammation secondary to the structure of the arch height along with an ingrown toenail deformity right second toe medial border painful when pressed.  Good digital perfusion well oriented x3     Assessment:  Chronic ingrown toenail deformity second right along with flatfoot deformity creating a lot of stress on the navicular right     Plan:  H&P reviewed both conditions educated him on this.  We will get a try a new type of orthotic to lift the arch and take pressure off the navicular right and I did go ahead discussed correction of the nailbed right patient wants this done and I allowed him to read and signed consent form.  I infiltrated the right second toe 60 mg Xylocaine Marcaine mixture sterile prep done and using sterile instrumentation remove the medial border exposed matrix applied phenol 3 applications 30 seconds followed by alcohol lavage sterile dressing gave instructions on soaks leave dressing on 24 hours but take it off  earlier if any throbbing were to occur and encouraged him to call questions concerns

## 2021-05-22 NOTE — Progress Notes (Signed)
SITUATION Reason for Consult: Evaluation for Bilateral Custom Foot Orthoses Patient / Caregiver Report: Patient needs something to support his prominent navicular  OBJECTIVE DATA: Patient History / Diagnosis:    ICD-10-CM   1. Plantar fasciitis, bilateral  M72.2 DG Foot 2 Views Right    DG Foot 2 Views Left      Current or Previous Devices: None and no history  Foot Examination: Skin presentation:   Intact Ulcers & Callousing:   None and no history Toe / Foot Deformities:  Pes planovalgus, prominent navicular, hammertoes Weight Bearing Presentation:  Planus Sensation:    Intact  ORTHOTIC RECOMMENDATION Recommended Device: 1x pair of custom functional foot orthotics  GOALS OF ORTHOSES - Reduce Pain - Prevent Foot Deformity - Prevent Progression of Further Foot Deformity - Relieve Pressure - Improve the Overall Biomechanical Function of the Foot and Lower Extremity.  ACTIONS PERFORMED Patient was casted for Foot Orthoses via crush box. Procedure was explained and patient tolerated procedure well. All questions were answered and concerns addressed.  PLAN Potential out of pocket cost was communicated to patient. Casts are to be sent to [facility] for fabrication. Patient is to be called for fitting when devices are ready.

## 2021-05-22 NOTE — Progress Notes (Signed)
SITUATION Reason for Consult: Evaluation for Bilateral Custom Foot Orthoses Patient / Caregiver Report: Patient needs something to support his prominent navicular  OBJECTIVE DATA: Patient History / Diagnosis:    ICD-10-CM   1. Plantar fasciitis, bilateral  M72.2 DG Foot 2 Views Right    DG Foot 2 Views Left      Current or Previous Devices: None and no history  Foot Examination: Skin presentation:   Intact Ulcers & Callousing:   None and no history Toe / Foot Deformities:  Pes planovalgus, prominent navicular, hammertoes Weight Bearing Presentation:  Planus Sensation:    Intact  ORTHOTIC RECOMMENDATION Recommended Device: 1x pair of custom functional foot orthotics  GOALS OF ORTHOSES - Reduce Pain - Prevent Foot Deformity - Prevent Progression of Further Foot Deformity - Relieve Pressure - Improve the Overall Biomechanical Function of the Foot and Lower Extremity.  ACTIONS PERFORMED Patient was casted for Foot Orthoses via crush box. Procedure was explained and patient tolerated procedure well. All questions were answered and concerns addressed.  PLAN Potential out of pocket cost was communicated to patient. Casts are to be sent to [facility] for fabrication. Patient is to be called for fitting when devices are ready.

## 2021-05-22 NOTE — Patient Instructions (Signed)

## 2021-06-12 ENCOUNTER — Telehealth: Payer: Self-pay | Admitting: Podiatry

## 2021-06-12 NOTE — Telephone Encounter (Signed)
Orthotics in.. lvm for pt to call to schedule an appt to pick them up. °

## 2021-06-13 ENCOUNTER — Ambulatory Visit: Payer: Medicare PPO

## 2021-06-13 ENCOUNTER — Other Ambulatory Visit: Payer: Self-pay

## 2021-06-13 DIAGNOSIS — M722 Plantar fascial fibromatosis: Secondary | ICD-10-CM

## 2021-06-13 NOTE — Progress Notes (Signed)
SITUATION: Reason for Visit: Fitting and Delivery of Custom Fabricated Foot Orthoses Patient Report: Patient reports comfort and is satisfied with device.  OBJECTIVE DATA: Patient History / Diagnosis:     ICD-10-CM   1. Plantar fasciitis, bilateral  M72.2       Provided Device:  Custom Functional Foot Orthotics  GOAL OF ORTHOSIS - Improve gait - Decrease energy expenditure - Improve Balance - Provide Triplanar stability of foot complex - Facilitate motion  ACTIONS PERFORMED Patient was fit with foot orthotics trimmed to shoe last. Patient tolerated fitting procedure.   Patient was provided with verbal and written instruction and demonstration regarding donning, doffing, wear, care, proper fit, function, purpose, cleaning, and use of the orthosis and in all related precautions and risks and benefits regarding the orthosis.  Patient was also provided with verbal instruction regarding how to report any failures or malfunctions of the orthosis and necessary follow up care. Patient was also instructed to contact our office regarding any change in status that may affect the function of the orthosis.  Patient demonstrated independence with proper donning, doffing, and fit and verbalized understanding of all instructions.  PLAN: Patient is to follow up in one week or as necessary (PRN). All questions were answered and concerns addressed. Plan of care was discussed with and agreed upon by the patient.

## 2021-07-03 DIAGNOSIS — Z1331 Encounter for screening for depression: Secondary | ICD-10-CM | POA: Diagnosis not present

## 2021-07-03 DIAGNOSIS — Z6825 Body mass index (BMI) 25.0-25.9, adult: Secondary | ICD-10-CM | POA: Diagnosis not present

## 2021-07-03 DIAGNOSIS — R7309 Other abnormal glucose: Secondary | ICD-10-CM | POA: Diagnosis not present

## 2021-07-03 DIAGNOSIS — E663 Overweight: Secondary | ICD-10-CM | POA: Diagnosis not present

## 2021-07-03 DIAGNOSIS — Z0000 Encounter for general adult medical examination without abnormal findings: Secondary | ICD-10-CM | POA: Diagnosis not present

## 2021-07-28 ENCOUNTER — Other Ambulatory Visit: Payer: Self-pay

## 2021-07-28 ENCOUNTER — Ambulatory Visit
Admission: EM | Admit: 2021-07-28 | Discharge: 2021-07-28 | Disposition: A | Discharge status: Home / Self Care | Payer: Medicare PPO

## 2021-07-28 ENCOUNTER — Ambulatory Visit: Payer: Self-pay

## 2021-07-28 DIAGNOSIS — H109 Unspecified conjunctivitis: Secondary | ICD-10-CM

## 2021-07-28 MED ORDER — TOBRAMYCIN 0.3 % OP SOLN: 1.0000 [drp] | OPHTHALMIC | 0 refills | Status: DC

## 2021-07-28 NOTE — ED Provider Notes (Signed)
?[facility] ? ? ?MRN: [id number] DOB: [birth year] ? ?Subjective:  ? ?Dustin Jacobson is a 76 y.o. male presenting for 1 day history of acute onset redness and discharge of the right eye with slight itching.  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  Was not working with any particular dust particles.  However, he did have exposure to pinkeye through his daughter and her husband.  No contact lens use.  No vision changes.  No eye trauma. ? ?No current facility-administered medications for this encounter. ? ?Current Outpatient Medications:  ?  MELOXICAM PO, Take by mouth., Disp: , Rfl:   ? ?Allergies  ?Allergen Reactions  ? Relafen [Nabumetone] Hives, Nausea Only and Other (See Comments)  ?  Dizziness,fainting, itching ?  ? ? ?Past Medical History:  ?Diagnosis Date  ? Adenomatous colon polyp 2002  ? Early cataract   ? left  ? GERD (gastroesophageal reflux disease)   ? History of kidney stones   ? Primary localized osteoarthritis of left knee   ? Primary localized osteoarthritis of left knee   ? Primary localized osteoarthritis of right knee 04/14/2018  ? S/P colonoscopy 05/15/2006  ? Dr Laurine Blazer anal canal hemorrhoids, otherwise normal  ? S/P TKR (total knee replacement), left 04/14/2018  ? Schatzki's ring 05/15/2006  ? Last EGD Dr Rourk-> dilated 11F, small hiatal hernia  ?  ? ?Past Surgical History:  ?Procedure Laterality Date  ? BUNIONECTOMY    ? COLONOSCOPY  04/28/2011  ? Dr. Gala Romney: colonic diverticulosis, single diminutive polyp (tubular adenoma), 5 year surveillance  ? COLONOSCOPY N/A 02/18/2017  ? Procedure: COLONOSCOPY;  Surgeon: Daneil Dolin, MD;  [facility];  Service: Endoscopy;  Laterality: N/A;  1:45pm  ? ESOPHAGOGASTRODUODENOSCOPY  2012  ? Dr. Gala Romney: erosive reflux esophagitis, Schatzki's ring s/p dilation, H.pylori gastritis  ? ESOPHAGOGASTRODUODENOSCOPY N/A 02/18/2017  ? Procedure: ESOPHAGOGASTRODUODENOSCOPY (EGD);  Surgeon: Daneil Dolin, MD;  [facility];  Service: Endoscopy;  Laterality: N/A;  ? FINGER SURGERY Left   ? middle finger  ? INGUINAL HERNIA REPAIR    ? bilat  ? KNEE SURGERY    ? bilat  ? LITHOTRIPSY    ? x2  ? MALONEY DILATION N/A 02/18/2017  ? Procedure: MALONEY DILATION;  Surgeon: Daneil Dolin, MD;  [facility];  Service: Endoscopy;  Laterality: N/A;  ? TOTAL KNEE ARTHROPLASTY Left 07/28/2016  ? Procedure: TOTAL KNEE ARTHROPLASTY;  Surgeon: Elsie Saas, MD;  Location: [facility];  Service: Orthopedics;  Laterality: Left;  ? TOTAL KNEE ARTHROPLASTY Right 04/26/2018  ? Procedure: TOTAL KNEE ARTHROPLASTY;  Surgeon: Elsie Saas, MD;  Location: [facility];  Service: Orthopedics;  Laterality: Right;  ? UMBILICAL HERNIA REPAIR    ? ? ?Family History  ?Problem Relation Age of Onset  ? Heart failure Mother   ? Colon polyps Mother   ? Coronary artery disease Father   ? Lung cancer Brother   ? Anesthesia problems Neg Hx   ? Hypotension Neg Hx   ? Malignant hyperthermia Neg Hx   ? Pseudochol deficiency Neg Hx   ? Colon cancer Neg Hx   ? ? ?Social History  ? ?Tobacco Use  ? Smoking status: Former  ?  Packs/day: 0.00  ?  Types: Cigarettes  ? Smokeless tobacco: Never  ? Tobacco comments:  ?  quit about 40 yrs ago  ?Vaping Use  ? Vaping Use: Never used  ?Substance Use  Topics  ? Alcohol use: Yes  ?  Comment: once every one to two months  ? Drug use: No  ? ? ?ROS ? ? ?Objective:  ? ?Vitals: ?BP (!) 151/70 (BP Location: Right Arm)   Pulse 76   Temp 98.6 ?F (37 ?C) (Oral)   Resp 18   SpO2 95%  ? ?Physical Exam ?Constitutional:   ?   General: He is not in acute distress. ?   Appearance: Normal appearance. He is well-developed and normal weight. He is not ill-appearing, toxic-appearing or diaphoretic.  ?HENT:  ?   Head: Normocephalic and atraumatic.  ?   Right Ear: External ear normal.  ?   Left Ear: External ear normal.  ?   Nose: Nose normal.  ?   Mouth/Throat:  ?   Pharynx: Oropharynx is clear.  ?Eyes:  ?   General: Lids are  everted, no foreign bodies appreciated. No scleral icterus.    ?   Right eye: Discharge present. No foreign body or hordeolum.     ?   Left eye: No foreign body or discharge.  ?   Extraocular Movements: Extraocular movements intact.  ?   Conjunctiva/sclera:  ?   Right eye: Right conjunctiva is injected. No chemosis, exudate or hemorrhage. ?   Left eye: Left conjunctiva is not injected. No chemosis, exudate or hemorrhage. ?Cardiovascular:  ?   Rate and Rhythm: Normal rate.  ?Pulmonary:  ?   Effort: Pulmonary effort is normal.  ?Musculoskeletal:  ?   Cervical back: Normal range of motion.  ?Neurological:  ?   Mental Status: He is alert and oriented to person, place, and time.  ?Psychiatric:     ?   Mood and Affect: Mood normal.     ?   Behavior: Behavior normal.     ?   Thought Content: Thought content normal.     ?   Judgment: Judgment normal.  ? ? ?Assessment and Plan :  ? ?PDMP not reviewed this encounter. ? ?1. Bacterial conjunctivitis of right eye   ? ?Recommended tobramycin for coverage of bacterial conjunctivitis. Counseled patient on potential for adverse effects with medications prescribed/recommended today, ER and return-to-clinic precautions discussed, patient verbalized understanding. ? ?  ?Jaynee Eagles, PA-C ?07/28/21 1049 ? ?

## 2021-07-28 NOTE — ED Triage Notes (Signed)
Pt reports itching, discharge and redness in right eye x 1 day. States he though he had a foreign body in the right eye, but his family had pink eye 1 week ago.  ?

## 2021-07-30 DIAGNOSIS — H9209 Otalgia, unspecified ear: Secondary | ICD-10-CM | POA: Diagnosis not present

## 2021-07-30 DIAGNOSIS — E663 Overweight: Secondary | ICD-10-CM | POA: Diagnosis not present

## 2021-07-30 DIAGNOSIS — J00 Acute nasopharyngitis [common cold]: Secondary | ICD-10-CM | POA: Diagnosis not present

## 2021-07-30 DIAGNOSIS — Z6825 Body mass index (BMI) 25.0-25.9, adult: Secondary | ICD-10-CM | POA: Diagnosis not present

## 2021-09-10 DIAGNOSIS — L570 Actinic keratosis: Secondary | ICD-10-CM | POA: Diagnosis not present

## 2021-09-10 DIAGNOSIS — L821 Other seborrheic keratosis: Secondary | ICD-10-CM | POA: Diagnosis not present

## 2021-09-10 DIAGNOSIS — D485 Neoplasm of uncertain behavior of skin: Secondary | ICD-10-CM | POA: Diagnosis not present

## 2021-10-07 DIAGNOSIS — M19012 Primary osteoarthritis, left shoulder: Secondary | ICD-10-CM | POA: Diagnosis not present

## 2021-11-12 ENCOUNTER — Telehealth: Payer: Self-pay

## 2021-11-12 ENCOUNTER — Telehealth: Payer: Self-pay | Admitting: Podiatry

## 2021-11-12 ENCOUNTER — Other Ambulatory Visit: Payer: Self-pay | Admitting: Podiatry

## 2021-11-12 ENCOUNTER — Ambulatory Visit (INDEPENDENT_AMBULATORY_CARE_PROVIDER_SITE_OTHER): Payer: Medicare PPO

## 2021-11-12 ENCOUNTER — Ambulatory Visit: Payer: Medicare PPO | Admitting: Podiatry

## 2021-11-12 DIAGNOSIS — L600 Ingrowing nail: Secondary | ICD-10-CM | POA: Diagnosis not present

## 2021-11-12 DIAGNOSIS — T8484XA Pain due to internal orthopedic prosthetic devices, implants and grafts, initial encounter: Secondary | ICD-10-CM | POA: Diagnosis not present

## 2021-11-12 MED ORDER — HYDROCODONE-ACETAMINOPHEN 10-325 MG PO TABS: 1.0000 | ORAL_TABLET | ORAL | 0 refills | Status: AC | PRN

## 2021-11-12 MED ORDER — DOXYCYCLINE HYCLATE 100 MG PO TABS: 100.0000 mg | ORAL_TABLET | Freq: Two times a day (BID) | ORAL | 0 refills | Status: DC

## 2021-11-12 NOTE — Telephone Encounter (Signed)
Pt is asking for a RX for pain.  Please advise

## 2021-11-12 NOTE — Telephone Encounter (Signed)
DOS 11/14/2021  [employer]   The following codes do not require a pre-authorization Created on 11/12/2021  [place] 05/27/2019 - 05/25/9998  Service info 20680 Removal of implant; deep (eg, buried wire, pin, screw, metal band, nail, rod or plate)

## 2021-11-12 NOTE — Progress Notes (Signed)
HPI: 76 y.o. male presenting today for complaint of pain and tenderness associated to the right second digit.  Patient states that he does have a history of an ingrown toenail to the right second digit that was performed 05/22/2021 by Dr. Paulla Dolly, podiatry.  Patient states that since then he has had continued pain and tenderness with redness and swelling to the right second toe.  He also has a history of hammertoe repair to the second digit with orthopedic hardware.  He mentioned also that there has been attempts in the past to remove the orthopedic screw within the second toe with no improvement.  He continues to have pain and tenderness to the toe on a daily basis.  He presents for further treatment and evaluation  Past Medical History:  Diagnosis Date   Adenomatous colon polyp 2002   Early cataract    left   GERD (gastroesophageal reflux disease)    History of kidney stones    Primary localized osteoarthritis of left knee    Primary localized osteoarthritis of left knee    Primary localized osteoarthritis of right knee 04/14/2018   S/P colonoscopy 05/15/2006   Dr Laurine Blazer anal canal hemorrhoids, otherwise normal   S/P TKR (total knee replacement), left 04/14/2018   Schatzki's ring 05/15/2006   Last EGD Dr Rourk-> dilated 90F, small hiatal hernia    Past Surgical History:  Procedure Laterality Date   BUNIONECTOMY     COLONOSCOPY  04/28/2011   Dr. Gala Romney: colonic diverticulosis, single diminutive polyp (tubular adenoma), 5 year surveillance   COLONOSCOPY N/A 02/18/2017   Procedure: COLONOSCOPY;  Surgeon: Daneil Dolin, MD;  [facility];  Service: Endoscopy;  Laterality: N/A;  1:45pm   ESOPHAGOGASTRODUODENOSCOPY  2012   Dr. Gala Romney: erosive reflux esophagitis, Schatzki's ring s/p dilation, H.pylori gastritis   ESOPHAGOGASTRODUODENOSCOPY N/A 02/18/2017   Procedure: ESOPHAGOGASTRODUODENOSCOPY (EGD);  Surgeon: Daneil Dolin, MD;  [facility];  Service:  Endoscopy;  Laterality: N/A;   FINGER SURGERY Left    middle finger   INGUINAL HERNIA REPAIR     bilat   KNEE SURGERY     bilat   LITHOTRIPSY     x2   MALONEY DILATION N/A 02/18/2017   Procedure: MALONEY DILATION;  Surgeon: Daneil Dolin, MD;  [facility];  Service: Endoscopy;  Laterality: N/A;   TOTAL KNEE ARTHROPLASTY Left 07/28/2016   Procedure: TOTAL KNEE ARTHROPLASTY;  Surgeon: Elsie Saas, MD;  Location: [facility];  Service: Orthopedics;  Laterality: Left;   TOTAL KNEE ARTHROPLASTY Right 04/26/2018   Procedure: TOTAL KNEE ARTHROPLASTY;  Surgeon: Elsie Saas, MD;  Location: [facility];  Service: Orthopedics;  Laterality: Right;   UMBILICAL HERNIA REPAIR      Allergies  Allergen Reactions   Relafen [Nabumetone] Hives, Nausea Only and Other (See Comments)    Dizziness,fainting, itching      Physical Exam: General: The patient is alert and oriented x3 in no acute distress.  Dermatology: Skin is warm, dry and supple bilateral lower extremities.  Erythema with edema noted to the medial border of the nail plate of the right second digit.  It is very sensitive to touch.  There is also some fluctuance to the area as well.  No active drainage during evaluation.  Vascular: Palpable pedal pulses bilaterally. Capillary refill within normal limits.  Clinically no concern for vascular compromise  Neurological: Light touch and protective threshold grossly intact  Musculoskeletal Exam: No pedal deformities noted.  Prior history of  bunionectomy and hammertoe repair to the second digit of the right foot  Radiographic Exam:  2 crossing K wires noted to the distal half of the first metatarsal which appears stable and intact.  Joint spaces preserved.  Orthopedic screw noted to the second digit of the right foot extending into the base of the proximal phalanx.  The distal portion of the screw is lacking the head.  It protrudes through the DIPJ and off to the side of the distal phalanx  medial aspect.  This correlates clinically.  No gas within the tissue.  No acute fractures identified.  Assessment: 1.  Symptomatic orthopedic screw right second digit   Plan of Care:  1. Patient evaluated. X-Rays reviewed.  2.  Initially upon evaluation the decision was made to numb the right second digit and debrided the nail avulsion portion of the medial aspect of the right second toenail.  The toe was prepped in aseptic manner and digital block performed using 3 mL of 2% lidocaine plain 3.  After debridement of the superficial skin immediately the orthopedic screw protruding from the distal tip of the toe was visualized.  An attempt was made using forceps to remove the screw however it is solidly implanted.  Dressings were applied with instructions to keep clean dry and intact until we can arrange removal of the orthopedic screw over the next day or 2.. 4.  I explained to the patient that the his symptoms from the second toe are coming from the exposed protruding portion of the orthopedic screw in the toe.  Patient understands.  Explained the need to have the orthopedic screw completely removed if possible.  This was unsuccessful here in the office so we will take the patient to the OR surgery center for removal of orthopedic screw.  All possible complications and details of the procedure were explained.  No guarantees were expressed or implied. 5.  Authorization for surgery was initiated today.  Surgery will consist of removal of orthopedic screw second digit right foot 6.  We will get the patient added onto the schedule as an urgent add-on hopefully for Thursday, 11/14/2021 7.  In the meantime prescription for doxycycline 100 mg 2 times daily #20 8.  Return to clinic 1 week postop      Edrick Kins, DPM [facility]  Dr. Edrick Kins, DPM    [address], [place] [address]                Office [phone]  Fax [phone]

## 2021-11-12 NOTE — Progress Notes (Signed)
PRN pain 

## 2021-11-12 NOTE — Telephone Encounter (Signed)
Prescription for Vicodin sent to the pharmacy

## 2021-11-13 NOTE — Telephone Encounter (Signed)
Pt called and aware of his RX is ready for pickup.

## 2021-11-14 DIAGNOSIS — T8484XA Pain due to internal orthopedic prosthetic devices, implants and grafts, initial encounter: Secondary | ICD-10-CM | POA: Diagnosis not present

## 2021-11-14 DIAGNOSIS — Z4889 Encounter for other specified surgical aftercare: Secondary | ICD-10-CM | POA: Diagnosis not present

## 2021-11-15 ENCOUNTER — Telehealth: Payer: Self-pay | Admitting: *Deleted

## 2021-11-21 ENCOUNTER — Encounter: Payer: Medicare PPO | Admitting: Podiatry

## 2021-11-22 ENCOUNTER — Ambulatory Visit (INDEPENDENT_AMBULATORY_CARE_PROVIDER_SITE_OTHER): Payer: Medicare PPO | Admitting: Podiatry

## 2021-11-22 ENCOUNTER — Ambulatory Visit (INDEPENDENT_AMBULATORY_CARE_PROVIDER_SITE_OTHER): Payer: Medicare PPO

## 2021-11-22 DIAGNOSIS — L600 Ingrowing nail: Secondary | ICD-10-CM

## 2021-11-22 DIAGNOSIS — M79671 Pain in right foot: Secondary | ICD-10-CM | POA: Diagnosis not present

## 2021-11-22 DIAGNOSIS — T8484XA Pain due to internal orthopedic prosthetic devices, implants and grafts, initial encounter: Secondary | ICD-10-CM

## 2021-11-22 DIAGNOSIS — Z9889 Other specified postprocedural states: Secondary | ICD-10-CM

## 2021-11-22 MED ORDER — CICLOPIROX 8 % EX SOLN: Freq: Every day | CUTANEOUS | 0 refills | Status: DC

## 2021-11-22 NOTE — Progress Notes (Signed)
  Subjective:  Patient ID: Dustin Jacobson, male    DOB: 1945-10-03,  MRN: [id number]  No chief complaint on file.   DOS: 11/14/2021 Procedure: Removal of orthopedic screw and performing a Winograd ingrown procedure  75 y.o. male returns for post-op check.  Patient states he is doing okay minimal pain.  He is ambulating with his shoe on.  He also has secondary complaint of nail fungus for which he would like to know if he can get Penlac.  He is known to Dr. Amalia Hailey he denies any other acute complaints  Review of Systems: Negative except as noted in the HPI. Denies N/V/F/Ch.  Past Medical History:  Diagnosis Date   Adenomatous colon polyp 2002   Early cataract    left   GERD (gastroesophageal reflux disease)    History of kidney stones    Primary localized osteoarthritis of left knee    Primary localized osteoarthritis of left knee    Primary localized osteoarthritis of right knee 04/14/2018   S/P colonoscopy 05/15/2006   Dr Laurine Blazer anal canal hemorrhoids, otherwise normal   S/P TKR (total knee replacement), left 04/14/2018   Schatzki's ring 05/15/2006   Last EGD Dr Rourk-> dilated 13F, small hiatal hernia    Current Outpatient Medications:    ciclopirox (PENLAC) 8 % solution, Apply topically at bedtime. Apply over nail and surrounding skin. Apply daily over previous coat. After seven (7) days, may remove with alcohol and continue cycle., Disp: 6.6 mL, Rfl: 0   doxycycline (VIBRA-TABS) 100 MG tablet, Take 1 tablet (100 mg total) by mouth 2 (two) times daily., Disp: 20 tablet, Rfl: 0   MELOXICAM PO, Take by mouth., Disp: , Rfl:    tobramycin (TOBREX) 0.3 % ophthalmic solution, Place 1 drop into the right eye every 4 (four) hours., Disp: 5 mL, Rfl: 0  Social History   Tobacco Use  Smoking Status Former   Packs/day: 0.00   Types: Cigarettes  Smokeless Tobacco Never  Tobacco Comments   quit about 40 yrs ago    Allergies  Allergen Reactions   Relafen [Nabumetone] Hives,  Nausea Only and Other (See Comments)    Dizziness,fainting, itching    Objective:  There were no vitals filed for this visit. There is no height or weight on file to calculate BMI. Constitutional Well developed. Well nourished.  Vascular Foot warm and well perfused. Capillary refill normal to all digits.   Neurologic Normal speech. Oriented to person, place, and time. Epicritic sensation to light touch grossly present bilaterally.  Dermatologic Skin healing well without signs of infection. Skin edges well coapted without signs of infection.  Orthopedic: Tenderness to palpation noted about the surgical site.   Radiographs: None Assessment:   1. Painful orthopaedic hardware ([place])   2. Ingrown toenail of right foot   3. Status post foot surgery    Plan:  Patient was evaluated and treated and all questions answered.  S/p foot surgery right -Progressing as expected post-operatively. -XR: None -WB Status: Weightbearing as tolerated in surgical shoe -Sutures: Intact.  No clinical signs of [place] is noted no complication noted. -Medications: None -Foot redressed. - Penlac was dispensed for nail fungus.  I have asked him to apply twice a day  No follow-ups on file.

## 2021-11-29 ENCOUNTER — Ambulatory Visit (INDEPENDENT_AMBULATORY_CARE_PROVIDER_SITE_OTHER): Payer: Medicare PPO | Admitting: Podiatry

## 2021-11-29 ENCOUNTER — Ambulatory Visit (INDEPENDENT_AMBULATORY_CARE_PROVIDER_SITE_OTHER): Payer: Medicare PPO

## 2021-11-29 ENCOUNTER — Encounter: Payer: Self-pay | Admitting: Podiatry

## 2021-11-29 DIAGNOSIS — Z4889 Encounter for other specified surgical aftercare: Secondary | ICD-10-CM | POA: Diagnosis not present

## 2021-11-29 DIAGNOSIS — Z9889 Other specified postprocedural states: Secondary | ICD-10-CM

## 2021-11-29 NOTE — Progress Notes (Signed)
   Subjective:  Patient presents today status post removal of symptomatic orthopedic screw second digit right foot. DOS: 11/14/2021.  Patient states that he is feeling well.  He is walking and weightbearing in regular shoes.  He presents for further treatment evaluation  Past Medical History:  Diagnosis Date   Adenomatous colon polyp 2002   Early cataract    left   GERD (gastroesophageal reflux disease)    History of kidney stones    Primary localized osteoarthritis of left knee    Primary localized osteoarthritis of left knee    Primary localized osteoarthritis of right knee 04/14/2018   S/P colonoscopy 05/15/2006   Dr Laurine Blazer anal canal hemorrhoids, otherwise normal   S/P TKR (total knee replacement), left 04/14/2018   Schatzki's ring 05/15/2006   Last EGD Dr Rourk-> dilated 32F, small hiatal hernia      Objective/Physical Exam Neurovascular status intact.  Skin incisions appear to be well coapted with sutures intact. No sign of infectious process noted. No dehiscence. No active bleeding noted.  Negative for any significant edema  Radiographic Exam:  Absence of the orthopedic screw noted to the second digit right foot  Assessment: 1. s/p removal orthopedic screws second digit right foot. DOS: 11/14/2021   Plan of Care:  1. Patient was evaluated.  2.  Sutures removed 3.  Patient may resume regular shoes.  Full activity no restrictions 4.  OTC Tolcylen antifungal topical dispensed at checkout for onychomycosis of the toenails 5.  Return to clinic as needed  Edrick Kins, DPM [facility]  Dr. Edrick Kins, DPM    [address], [place] [address]                Office [phone]  Fax [phone]

## 2021-12-09 ENCOUNTER — Ambulatory Visit
Admission: EM | Admit: 2021-12-09 | Discharge: 2021-12-09 | Disposition: A | Discharge status: Home / Self Care | Payer: Medicare PPO

## 2021-12-09 DIAGNOSIS — J309 Allergic rhinitis, unspecified: Secondary | ICD-10-CM

## 2021-12-09 MED ORDER — CETIRIZINE HCL 10 MG PO TABS: 10.0000 mg | ORAL_TABLET | Freq: Every day | ORAL | 0 refills | Status: DC

## 2021-12-09 MED ORDER — PSEUDOEPH-BROMPHEN-DM 30-2-10 MG/5ML PO SYRP: 5.0000 mL | ORAL_SOLUTION | Freq: Four times a day (QID) | ORAL | 0 refills | Status: DC | PRN

## 2021-12-09 MED ORDER — FLUTICASONE PROPIONATE 50 MCG/ACT NA SUSP: 2.0000 | Freq: Every day | NASAL | 0 refills | Status: DC

## 2021-12-09 NOTE — Discharge Instructions (Addendum)
Take medication as directed. Continue your current allergy medication regimen. Increase fluids and get plenty of rest. May take over-the-counter ibuprofen or Tylenol as needed for pain, fever, or general discomfort. Recommend normal saline nasal spray to help with nasal congestion throughout the day. For your cough, it may be helpful to use a humidifier at bedtime during sleep. If your symptoms fail to improve within the next 7 to 10 days, please follow-up in our clinic.  

## 2021-12-09 NOTE — ED Provider Notes (Signed)
[facility]    CSN: [id number] Arrival date & time: 12/09/21  1419      History   Chief Complaint Chief Complaint  Patient presents with   Nasal Congestion         HPI BARNABY RIPPEON is a 76 y.o. male.   HPI  Patient presents with nasal congestion that is been present for the past 3 days.  Symptoms started after he states he was moving some hay.  Thinks he may have inhaled some dust from the hay.  He also complains of postnasal drainage and soreness on the right side of his throat where the drainage is most present.  He thinks he may have had a subjective fever, but did not check it at home.  He denies chills, headache, ear pain, cough, or GI symptoms.  States he has been taking over-the-counter allergy medicine.  He states the allergy medicine does give him some relief of his symptoms.  Past Medical History:  Diagnosis Date   Adenomatous colon polyp 2002   Early cataract    left   GERD (gastroesophageal reflux disease)    History of kidney stones    Primary localized osteoarthritis of left knee    Primary localized osteoarthritis of left knee    Primary localized osteoarthritis of right knee 04/14/2018   S/P colonoscopy 05/15/2006   Dr Laurine Blazer anal canal hemorrhoids, otherwise normal   S/P TKR (total knee replacement), left 04/14/2018   Schatzki's ring 05/15/2006   Last EGD Dr Rourk-> dilated 72F, small hiatal hernia    Patient Active Problem List   Diagnosis Date Noted   Primary localized osteoarthritis of right knee 04/14/2018   S/P TKR (total knee replacement), left 04/14/2018   Primary localized osteoarthritis of left knee    GERD (gastroesophageal reflux disease) 03/31/2011   Adenomatous colon polyp 03/31/2011   Dysphagia 03/31/2011   Schatzki's ring 03/31/2011    Past Surgical History:  Procedure Laterality Date   BUNIONECTOMY     COLONOSCOPY  04/28/2011   Dr. Gala Romney: colonic diverticulosis, single diminutive polyp (tubular adenoma), 5  year surveillance   COLONOSCOPY N/A 02/18/2017   Procedure: COLONOSCOPY;  Surgeon: Daneil Dolin, MD;  Location: [facility];  Service: Endoscopy;  Laterality: N/A;  1:45pm   ESOPHAGOGASTRODUODENOSCOPY  2012   Dr. Gala Romney: erosive reflux esophagitis, Schatzki's ring s/p dilation, H.pylori gastritis   ESOPHAGOGASTRODUODENOSCOPY N/A 02/18/2017   Procedure: ESOPHAGOGASTRODUODENOSCOPY (EGD);  Surgeon: Daneil Dolin, MD;  Location: [facility];  Service: Endoscopy;  Laterality: N/A;   FINGER SURGERY Left    middle finger   INGUINAL HERNIA REPAIR     bilat   KNEE SURGERY     bilat   LITHOTRIPSY     x2   MALONEY DILATION N/A 02/18/2017   Procedure: MALONEY DILATION;  Surgeon: Daneil Dolin, MD;  Location: [facility];  Service: Endoscopy;  Laterality: N/A;   TOTAL KNEE ARTHROPLASTY Left 07/28/2016   Procedure: TOTAL KNEE ARTHROPLASTY;  Surgeon: Elsie Saas, MD;  Location: [facility];  Service: Orthopedics;  Laterality: Left;   TOTAL KNEE ARTHROPLASTY Right 04/26/2018   Procedure: TOTAL KNEE ARTHROPLASTY;  Surgeon: Elsie Saas, MD;  Location: [facility];  Service: Orthopedics;  Laterality: Right;   UMBILICAL HERNIA REPAIR         Home Medications    Prior to Admission medications   Medication Sig Start Date End Date Taking? Authorizing Provider  acetaminophen (TYLENOL) 500 MG tablet Take 500 mg by mouth  every 6 (six) hours as needed.   Yes [provider]  brompheniramine-pseudoephedrine-DM 30-2-10 MG/5ML syrup Take 5 mLs by mouth 4 (four) times daily as needed. 12/09/21  Yes Zacory Fiola-Warren, Alda Lea, NP  cetirizine (ZYRTEC) 10 MG tablet Take 1 tablet (10 mg total) by mouth daily. 12/09/21  Yes Angelino Rumery-Warren, Alda Lea, NP  fluticasone (FLONASE) 50 MCG/ACT nasal spray Place 2 sprays into both nostrils daily. 12/09/21  Yes Kynnedi Zweig-Warren, Alda Lea, NP  ciclopirox (PENLAC) 8 % solution Apply topically at bedtime. Apply over nail and surrounding skin. Apply daily over previous coat.  After seven (7) days, may remove with alcohol and continue cycle. 11/22/21   Felipa Furnace, DPM  doxycycline (VIBRA-TABS) 100 MG tablet Take 1 tablet (100 mg total) by mouth 2 (two) times daily. 11/12/21   Edrick Kins, DPM  MELOXICAM PO Take by mouth.    [provider]  tobramycin (TOBREX) 0.3 % ophthalmic solution Place 1 drop into the right eye every 4 (four) hours. 07/28/21   Jaynee Eagles, PA-C  gabapentin (NEURONTIN) 300 MG capsule 1 tablet before bed for nerve pain 04/27/18 12/30/19  Shepperson, Dellis Anes, PA-C    Family History Family History  Problem Relation Age of Onset   Heart failure Mother    Colon polyps Mother    Coronary artery disease Father    Lung cancer Brother    Anesthesia problems Neg Hx    Hypotension Neg Hx    Malignant hyperthermia Neg Hx    Pseudochol deficiency Neg Hx    Colon cancer Neg Hx     Social History Social History   Tobacco Use   Smoking status: Former    Packs/day: 0.00    Types: Cigarettes   Smokeless tobacco: Never   Tobacco comments:    quit about 40 yrs ago  Vaping Use   Vaping Use: Never used  Substance Use Topics   Alcohol use: Yes    Comment: once every one to two months   Drug use: No     Allergies   Relafen [nabumetone]   Review of Systems Review of Systems Per HPI  Physical Exam Triage Vital Signs ED Triage Vitals  Enc Vitals Group     BP 12/09/21 1442 120/70     Pulse Rate 12/09/21 1442 90     Resp 12/09/21 1442 18     Temp 12/09/21 1442 98.9 F (37.2 C)     Temp Source 12/09/21 1442 Oral     SpO2 12/09/21 1442 96 %     Weight --      Height --      Head Circumference --      Peak Flow --      Pain Score 12/09/21 1441 0     Pain Loc --      Pain Edu? --      Excl. in [place]? --    No data found.  Updated Vital Signs BP 120/70 (BP Location: Right Arm)   Pulse 90   Temp 98.9 F (37.2 C) (Oral)   Resp 18   SpO2 96%   Visual Acuity Right Eye Distance:   Left Eye Distance:   Bilateral  Distance:    Right Eye Near:   Left Eye Near:    Bilateral Near:     Physical Exam Vitals and nursing note reviewed.  Constitutional:      General: He is not in acute distress.    Appearance: Normal appearance. He is well-developed.  HENT:  Head: Normocephalic and atraumatic.     Right Ear: Tympanic membrane, ear canal and external ear normal.     Left Ear: Tympanic membrane, ear canal and external ear normal.     Nose: Congestion present.     Mouth/Throat:     Mouth: Mucous membranes are moist.     Pharynx: Posterior oropharyngeal erythema present.  Eyes:     Extraocular Movements: Extraocular movements intact.     Conjunctiva/sclera: Conjunctivae normal.     Pupils: Pupils are equal, round, and reactive to light.  Neck:     Thyroid: No thyromegaly.     Trachea: No tracheal deviation.  Cardiovascular:     Rate and Rhythm: Normal rate and regular rhythm.     Pulses: Normal pulses.     Heart sounds: Normal heart sounds.  Pulmonary:     Effort: Pulmonary effort is normal.     Breath sounds: Normal breath sounds.  Abdominal:     General: Bowel sounds are normal. There is no distension.     Palpations: Abdomen is soft.     Tenderness: There is no abdominal tenderness.  Musculoskeletal:     Cervical back: Normal range of motion and neck supple.  Lymphadenopathy:     Cervical: No cervical adenopathy.  Skin:    General: Skin is warm and dry.  Neurological:     Mental Status: He is alert and oriented to person, place, and time.  Psychiatric:        Behavior: Behavior normal.        Thought Content: Thought content normal.        Judgment: Judgment normal.      UC Treatments / Results  Labs (all labs ordered are listed, but only abnormal results are displayed) Labs Reviewed - No data to display  EKG   Radiology No results found.  Procedures Procedures (including critical care time)  Medications Ordered in UC Medications - No data to display  Initial  Impression / Assessment and Plan / UC Course  I have reviewed the triage vital signs and the nursing notes.  Pertinent labs & imaging results that were available during my care of the patient were reviewed by me and considered in my medical decision making (see chart for details).  Patient presents for upper respiratory symptoms that been present for the past 3 days.  Patient's vital signs are stable, exam is benign.  Symptoms appear to be related to allergic rhinitis as they improve with use of the antihistamine.  We will start patient on cetirizine, fluticasone, and Bromfed for symptoms.  Supportive care recommendations were provided to the patient.  Patient advised to follow-up in this clinic or with his primary care physician if symptoms do not improve. Final Clinical Impressions(s) / UC Diagnoses   Final diagnoses:  Allergic rhinitis, unspecified seasonality, unspecified trigger     Discharge Instructions      Take medication as directed. Continue your current allergy medication regimen. Increase fluids and get plenty of rest. May take over-the-counter ibuprofen or Tylenol as needed for pain, fever, or general discomfort. Recommend normal saline nasal spray to help with nasal congestion throughout the day. For your cough, it may be helpful to use a humidifier at bedtime during sleep. If your symptoms fail to improve within the next 7 to 10 days, please follow-up in our clinic.     ED Prescriptions     Medication Sig Dispense Auth. Provider   cetirizine (ZYRTEC) 10 MG tablet Take 1 tablet (  10 mg total) by mouth daily. 30 tablet Elaysia Devargas-Warren, Alda Lea, NP   fluticasone (FLONASE) 50 MCG/ACT nasal spray Place 2 sprays into both nostrils daily. 16 g Jonan Seufert-Warren, Alda Lea, NP   brompheniramine-pseudoephedrine-DM 30-2-10 MG/5ML syrup Take 5 mLs by mouth 4 (four) times daily as needed. 140 mL Dante Cooter-Warren, Alda Lea, NP      PDMP not reviewed this encounter.   Tish Men, NP 12/09/21 1535

## 2021-12-09 NOTE — ED Triage Notes (Signed)
Pt reports nasal congestion and body aches x 1 week; right sided lower back pain 3 days. Pt reports he has many kidney infections. Acetaminophen gives some relief.

## 2021-12-13 ENCOUNTER — Encounter: Payer: Medicare PPO | Admitting: Podiatry

## 2021-12-13 DIAGNOSIS — Z6824 Body mass index (BMI) 24.0-24.9, adult: Secondary | ICD-10-CM | POA: Diagnosis not present

## 2021-12-13 DIAGNOSIS — J329 Chronic sinusitis, unspecified: Secondary | ICD-10-CM | POA: Diagnosis not present

## 2021-12-13 DIAGNOSIS — S335XXA Sprain of ligaments of lumbar spine, initial encounter: Secondary | ICD-10-CM | POA: Diagnosis not present

## 2021-12-23 DIAGNOSIS — K219 Gastro-esophageal reflux disease without esophagitis: Secondary | ICD-10-CM | POA: Diagnosis not present

## 2021-12-23 DIAGNOSIS — J329 Chronic sinusitis, unspecified: Secondary | ICD-10-CM | POA: Diagnosis not present

## 2021-12-23 DIAGNOSIS — Z6824 Body mass index (BMI) 24.0-24.9, adult: Secondary | ICD-10-CM | POA: Diagnosis not present

## 2022-03-04 DIAGNOSIS — L570 Actinic keratosis: Secondary | ICD-10-CM | POA: Diagnosis not present

## 2022-06-05 DIAGNOSIS — L570 Actinic keratosis: Secondary | ICD-10-CM | POA: Diagnosis not present

## 2022-07-04 DIAGNOSIS — Z125 Encounter for screening for malignant neoplasm of prostate: Secondary | ICD-10-CM | POA: Diagnosis not present

## 2022-07-04 DIAGNOSIS — Z0000 Encounter for general adult medical examination without abnormal findings: Secondary | ICD-10-CM | POA: Diagnosis not present

## 2022-07-04 DIAGNOSIS — E663 Overweight: Secondary | ICD-10-CM | POA: Diagnosis not present

## 2022-07-04 DIAGNOSIS — K219 Gastro-esophageal reflux disease without esophagitis: Secondary | ICD-10-CM | POA: Diagnosis not present

## 2022-07-04 DIAGNOSIS — Z0001 Encounter for general adult medical examination with abnormal findings: Secondary | ICD-10-CM | POA: Diagnosis not present

## 2022-07-04 DIAGNOSIS — Z1331 Encounter for screening for depression: Secondary | ICD-10-CM | POA: Diagnosis not present

## 2022-07-04 DIAGNOSIS — Z6825 Body mass index (BMI) 25.0-25.9, adult: Secondary | ICD-10-CM | POA: Diagnosis not present

## 2022-07-04 DIAGNOSIS — R7309 Other abnormal glucose: Secondary | ICD-10-CM | POA: Diagnosis not present

## 2022-07-07 ENCOUNTER — Encounter: Payer: Self-pay | Admitting: *Deleted

## 2022-07-11 DIAGNOSIS — J329 Chronic sinusitis, unspecified: Secondary | ICD-10-CM | POA: Diagnosis not present

## 2022-09-23 DIAGNOSIS — L570 Actinic keratosis: Secondary | ICD-10-CM | POA: Diagnosis not present

## 2022-12-08 DIAGNOSIS — K219 Gastro-esophageal reflux disease without esophagitis: Secondary | ICD-10-CM | POA: Diagnosis not present

## 2022-12-08 DIAGNOSIS — E785 Hyperlipidemia, unspecified: Secondary | ICD-10-CM | POA: Diagnosis not present

## 2022-12-08 DIAGNOSIS — J309 Allergic rhinitis, unspecified: Secondary | ICD-10-CM | POA: Diagnosis not present

## 2022-12-08 DIAGNOSIS — Z8249 Family history of ischemic heart disease and other diseases of the circulatory system: Secondary | ICD-10-CM | POA: Diagnosis not present

## 2022-12-08 DIAGNOSIS — Z791 Long term (current) use of non-steroidal anti-inflammatories (NSAID): Secondary | ICD-10-CM | POA: Diagnosis not present

## 2022-12-08 DIAGNOSIS — M1990 Unspecified osteoarthritis, unspecified site: Secondary | ICD-10-CM | POA: Diagnosis not present

## 2022-12-08 DIAGNOSIS — Z833 Family history of diabetes mellitus: Secondary | ICD-10-CM | POA: Diagnosis not present

## 2022-12-08 DIAGNOSIS — Z96659 Presence of unspecified artificial knee joint: Secondary | ICD-10-CM | POA: Diagnosis not present

## 2022-12-08 DIAGNOSIS — N182 Chronic kidney disease, stage 2 (mild): Secondary | ICD-10-CM | POA: Diagnosis not present

## 2022-12-25 DIAGNOSIS — J209 Acute bronchitis, unspecified: Secondary | ICD-10-CM | POA: Diagnosis not present

## 2022-12-30 ENCOUNTER — Encounter: Payer: Self-pay | Admitting: *Deleted

## 2023-02-02 DIAGNOSIS — M19012 Primary osteoarthritis, left shoulder: Secondary | ICD-10-CM | POA: Diagnosis not present

## 2023-02-02 DIAGNOSIS — M25512 Pain in left shoulder: Secondary | ICD-10-CM | POA: Diagnosis not present

## 2023-02-25 DIAGNOSIS — Z85828 Personal history of other malignant neoplasm of skin: Secondary | ICD-10-CM | POA: Diagnosis not present

## 2023-02-25 DIAGNOSIS — L570 Actinic keratosis: Secondary | ICD-10-CM | POA: Diagnosis not present

## 2023-02-25 DIAGNOSIS — D489 Neoplasm of uncertain behavior, unspecified: Secondary | ICD-10-CM | POA: Diagnosis not present

## 2023-02-25 DIAGNOSIS — L218 Other seborrheic dermatitis: Secondary | ICD-10-CM | POA: Diagnosis not present

## 2023-06-02 ENCOUNTER — Telehealth: Payer: Self-pay | Admitting: *Deleted

## 2023-06-02 NOTE — Telephone Encounter (Signed)
  Procedure: Colonoscopy  Height: 5 10 Weight: 172lbs        Have you had a colonoscopy before?  01/2017 Dr. Shaaron  Do you have family history of colon cancer?  no  Do you have a family history of polyps? yes  Previous colonoscopy with polyps removed? Not sure  Do you have a history colorectal cancer?   no  Are you diabetic?  no  Do you have a prosthetic or mechanical heart valve? no  Do you have a pacemaker/defibrillator?   no  Have you had endocarditis/atrial fibrillation?  no  Do you use supplemental oxygen/CPAP?  no  Have you had joint replacement within the last 12 months?  no  Do you tend to be constipated or have to use laxatives?  some   Do you have history of alcohol use? If yes, how much and how often.  no  Do you have history or are you using drugs? If yes, what do are you  using?  no  Have you ever had a stroke/heart attack?  no  Have you ever had a heart or other vascular stent placed,?no  Do you take weight loss medication? no  Do you take any blood-thinning medications such as: (Plavix, aspirin , Coumadin, Aggrenox, Brilinta, Xarelto, Eliquis, Pradaxa, Savaysa or Effient)? no  If yes we need the name, milligram, dosage and who is prescribing doctor:               Current Outpatient Medications  Medication Sig Dispense Refill   meloxicam  (MOBIC ) 15 MG tablet Take 15 mg by mouth as needed.     No current facility-administered medications for this visit.    Allergies  Allergen Reactions   Relafen [Nabumetone] Hives, Nausea Only and Other (See Comments)    Dizziness,fainting, itching

## 2023-06-22 NOTE — Telephone Encounter (Signed)
LMOVM to call back

## 2023-06-24 ENCOUNTER — Other Ambulatory Visit: Payer: Self-pay | Admitting: *Deleted

## 2023-06-24 ENCOUNTER — Encounter: Payer: Self-pay | Admitting: *Deleted

## 2023-06-24 MED ORDER — PEG 3350-KCL-NA BICARB-NACL 420 G PO SOLR
4000.0000 mL | Freq: Once | ORAL | 0 refills | Status: AC
Start: 2023-06-24 — End: 2023-06-24

## 2023-06-24 NOTE — Telephone Encounter (Signed)
Pt has been scheduled for 07/30/23. Instructions mailed and prep sent to the pharmacy

## 2023-07-09 DIAGNOSIS — M5416 Radiculopathy, lumbar region: Secondary | ICD-10-CM | POA: Diagnosis not present

## 2023-07-09 DIAGNOSIS — Z6825 Body mass index (BMI) 25.0-25.9, adult: Secondary | ICD-10-CM | POA: Diagnosis not present

## 2023-07-09 DIAGNOSIS — E559 Vitamin D deficiency, unspecified: Secondary | ICD-10-CM | POA: Diagnosis not present

## 2023-07-09 DIAGNOSIS — D518 Other vitamin B12 deficiency anemias: Secondary | ICD-10-CM | POA: Diagnosis not present

## 2023-07-09 DIAGNOSIS — Z0001 Encounter for general adult medical examination with abnormal findings: Secondary | ICD-10-CM | POA: Diagnosis not present

## 2023-07-09 DIAGNOSIS — Z1331 Encounter for screening for depression: Secondary | ICD-10-CM | POA: Diagnosis not present

## 2023-07-09 DIAGNOSIS — G9332 Myalgic encephalomyelitis/chronic fatigue syndrome: Secondary | ICD-10-CM | POA: Diagnosis not present

## 2023-07-09 DIAGNOSIS — R7309 Other abnormal glucose: Secondary | ICD-10-CM | POA: Diagnosis not present

## 2023-07-09 DIAGNOSIS — Z125 Encounter for screening for malignant neoplasm of prostate: Secondary | ICD-10-CM | POA: Diagnosis not present

## 2023-07-09 NOTE — Telephone Encounter (Signed)
Referral has been clsoed

## 2023-07-24 ENCOUNTER — Telehealth: Payer: Self-pay | Admitting: *Deleted

## 2023-07-24 NOTE — Telephone Encounter (Signed)
 Pt called and stated his PCP put him on 2 new medications, folic acid 1mg  daily and glimepiride 2 mg daily. He has an upcoming procedure next week and wanted to know if needed to hold his medications. Advised pt to hold glimepiride morning of procedure. Pt verbalized understanding.

## 2023-07-27 ENCOUNTER — Telehealth: Payer: Self-pay | Admitting: *Deleted

## 2023-07-27 NOTE — Telephone Encounter (Signed)
 Dates of service 07/30/2023 - 08/30/2023 Authorization #[id number]

## 2023-07-30 ENCOUNTER — Other Ambulatory Visit: Payer: Self-pay

## 2023-07-30 ENCOUNTER — Ambulatory Visit (HOSPITAL_BASED_OUTPATIENT_CLINIC_OR_DEPARTMENT_OTHER): Admitting: Certified Registered"

## 2023-07-30 ENCOUNTER — Ambulatory Visit (HOSPITAL_COMMUNITY)
Admission: RE | Admit: 2023-07-30 | Discharge: 2023-07-30 | Disposition: A | Discharge status: Home / Self Care | Payer: Medicare PPO | Attending: Internal Medicine | Admitting: Internal Medicine

## 2023-07-30 ENCOUNTER — Encounter (HOSPITAL_COMMUNITY): Payer: Self-pay | Admitting: Internal Medicine

## 2023-07-30 ENCOUNTER — Ambulatory Visit (HOSPITAL_COMMUNITY): Admitting: Certified Registered"

## 2023-07-30 ENCOUNTER — Encounter (HOSPITAL_COMMUNITY)
Admission: RE | Disposition: A | Discharge status: Home / Self Care | Payer: Self-pay | Source: Home / Self Care | Attending: Internal Medicine

## 2023-07-30 DIAGNOSIS — Z1211 Encounter for screening for malignant neoplasm of colon: Secondary | ICD-10-CM | POA: Insufficient documentation

## 2023-07-30 DIAGNOSIS — D125 Benign neoplasm of sigmoid colon: Secondary | ICD-10-CM | POA: Diagnosis not present

## 2023-07-30 DIAGNOSIS — Z860101 Personal history of adenomatous and serrated colon polyps: Secondary | ICD-10-CM | POA: Diagnosis not present

## 2023-07-30 DIAGNOSIS — Z87891 Personal history of nicotine dependence: Secondary | ICD-10-CM | POA: Insufficient documentation

## 2023-07-30 DIAGNOSIS — K5730 Diverticulosis of large intestine without perforation or abscess without bleeding: Secondary | ICD-10-CM | POA: Diagnosis not present

## 2023-07-30 DIAGNOSIS — K635 Polyp of colon: Secondary | ICD-10-CM | POA: Diagnosis not present

## 2023-07-30 DIAGNOSIS — K5140 Inflammatory polyps of colon without complications: Secondary | ICD-10-CM | POA: Diagnosis not present

## 2023-07-30 DIAGNOSIS — I10 Essential (primary) hypertension: Secondary | ICD-10-CM | POA: Diagnosis not present

## 2023-07-30 HISTORY — PX: POLYPECTOMY: SHX5525

## 2023-07-30 HISTORY — PX: COLONOSCOPY WITH PROPOFOL: SHX5780

## 2023-07-30 LAB — GLUCOSE, CAPILLARY: Glucose-Capillary: 88 mg/dL (ref 70–99)

## 2023-07-30 SURGERY — COLONOSCOPY WITH PROPOFOL
Anesthesia: General

## 2023-07-30 MED ORDER — PROPOFOL 500 MG/50ML IV EMUL
INTRAVENOUS | Status: DC | PRN
  Administered 2023-07-30: 150 ug/kg/min via INTRAVENOUS

## 2023-07-30 MED ORDER — EPHEDRINE 5 MG/ML INJ
INTRAVENOUS | Status: AC
  Filled 2023-07-30: qty 5

## 2023-07-30 MED ORDER — LACTATED RINGERS IV SOLN: INTRAVENOUS | Status: DC

## 2023-07-30 MED ORDER — PROPOFOL 10 MG/ML IV BOLUS
INTRAVENOUS | Status: DC | PRN
  Administered 2023-07-30 (×2): 50 mg via INTRAVENOUS
  Administered 2023-07-30: 100 mg via INTRAVENOUS

## 2023-07-30 MED ORDER — GLYCOPYRROLATE PF 0.2 MG/ML IJ SOSY
PREFILLED_SYRINGE | INTRAMUSCULAR | Status: DC | PRN
Start: 2023-07-30 — End: 2023-07-30
  Administered 2023-07-30 (×2): .1 mg via INTRAVENOUS

## 2023-07-30 MED ORDER — EPHEDRINE SULFATE-NACL 50-0.9 MG/10ML-% IV SOSY
PREFILLED_SYRINGE | INTRAVENOUS | Status: DC | PRN
Start: 2023-07-30 — End: 2023-07-30
  Administered 2023-07-30: 5 mg via INTRAVENOUS

## 2023-07-30 MED ORDER — GLYCOPYRROLATE PF 0.2 MG/ML IJ SOSY
PREFILLED_SYRINGE | INTRAMUSCULAR | Status: AC
Start: 2023-07-30 — End: ?
  Filled 2023-07-30: qty 1

## 2023-07-30 MED ORDER — LACTATED RINGERS IV SOLN: INTRAVENOUS | Status: DC | PRN

## 2023-07-30 MED ORDER — LIDOCAINE HCL (PF) 2 % IJ SOLN
INTRAMUSCULAR | Status: DC | PRN
  Administered 2023-07-30: 50 mg via INTRADERMAL

## 2023-07-30 NOTE — Anesthesia Preprocedure Evaluation (Signed)
 Anesthesia Evaluation  Patient identified by MRN, date of birth, ID band Patient awake    Reviewed: Allergy & Precautions, H&P , NPO status , Patient's Chart, lab work & pertinent test results, reviewed documented beta blocker date and time   Airway Mallampati: II  TM Distance: >3 FB Neck ROM: full    Dental no notable dental hx.    Pulmonary neg pulmonary ROS, former smoker   Pulmonary exam normal breath sounds clear to auscultation       Cardiovascular Exercise Tolerance: Good hypertension, negative cardio ROS  Rhythm:regular Rate:Normal     Neuro/Psych negative neurological ROS  negative psych ROS   GI/Hepatic negative GI ROS, Neg liver ROS,GERD  ,,  Endo/Other  negative endocrine ROS    Renal/GU negative Renal ROS  negative genitourinary   Musculoskeletal   Abdominal   Peds  Hematology negative hematology ROS (+)   Anesthesia Other Findings   Reproductive/Obstetrics negative OB ROS                             Anesthesia Physical Anesthesia Plan  ASA: 2  Anesthesia Plan: General   Post-op Pain Management:    Induction:   PONV Risk Score and Plan: Propofol infusion  Airway Management Planned:   Additional Equipment:   Intra-op Plan:   Post-operative Plan:   Informed Consent: I have reviewed the patients History and Physical, chart, labs and discussed the procedure including the risks, benefits and alternatives for the proposed anesthesia with the patient or authorized representative who has indicated his/her understanding and acceptance.     Dental Advisory Given  Plan Discussed with: CRNA  Anesthesia Plan Comments:        Anesthesia Quick Evaluation

## 2023-07-30 NOTE — Anesthesia Procedure Notes (Signed)
 Date/Time: 07/30/2023 8:00 AM  Performed by: Julian Reil, CRNAPre-anesthesia Checklist: Emergency Drugs available, Patient identified, Suction available and Patient being monitored Patient Re-evaluated:Patient Re-evaluated prior to induction Oxygen Delivery Method: Nasal cannula Induction Type: IV induction Placement Confirmation: positive ETCO2

## 2023-07-30 NOTE — Op Note (Signed)
 [facility] Patient Name: Dustin Jacobson Procedure Date: 07/30/2023 7:45 AM MRN: [id number] Date of Birth: 07/17/1945 Attending MD: Gennette Pac , MD, [id number] CSN: [id number] Age: 78 Admit Type: Outpatient Procedure:                Colonoscopy Indications:              High risk colon cancer surveillance: Personal                            history of colonic polyps Providers:                Gennette Pac, MD, Francoise Ceo RN, RN,                            Elinor Parkinson Referring MD:              Medicines:                Propofol per Anesthesia Complications:            No immediate complications. Estimated Blood Loss:     Estimated blood loss was minimal. Procedure:                Pre-Anesthesia Assessment:                           - Prior to the procedure, a History and Physical                            was performed, and patient medications and                            allergies were reviewed. The patient's tolerance of                            previous anesthesia was also reviewed. The risks                            and benefits of the procedure and the sedation                            options and risks were discussed with the patient.                            All questions were answered, and informed consent                            was obtained. Prior Anticoagulants: The patient has                            taken no anticoagulant or antiplatelet agents. ASA                            Grade Assessment: III - A patient with severe  systemic disease. After reviewing the risks and                            benefits, the patient was deemed in satisfactory                            condition to undergo the procedure.                           After obtaining informed consent, the colonoscope                            was passed under direct vision. Throughout the                            procedure, the patient's  blood pressure, pulse, and                            oxygen saturations were monitored continuously. The                            [id number]) scope was introduced through                            the anus and advanced to the the cecum, identified                            by appendiceal orifice and ileocecal valve. The                            colonoscopy was performed without difficulty. The                            patient tolerated the procedure well. The quality                            of the bowel preparation was adequate. The                            ileocecal valve, appendiceal orifice, and rectum                            were photographed. The entire colon was examined. Scope In: 8:05:40 AM Scope Out: 8:46:45 AM Scope Withdrawal Time: 0 hours 34 minutes 56 seconds  Total Procedure Duration: 0 hours 41 minutes 5 seconds  Findings:      The perianal and digital rectal examinations were normal.      Scattered small-mouthed diverticula were found in the sigmoid colon and       descending colon.      A 5 mm polyp was found in the sigmoid colon. The polyp was sessile. It       was difficult to reach with the snare both 20 and the 10 eventually I       was able to engage it with a 20 and remove it. the  polyp was removed       with a cold snare. Resection and retrieval were complete. Estimated       blood loss was minimal.      The exam was otherwise without abnormality on direct and retroflexion       views. Impression:               - Diverticulosis in the sigmoid colon and in the                            descending colon.                           - One 5 mm polyp in the sigmoid colon, removed with                            a cold snare. Resected and retrieved.                           - The examination was otherwise normal on direct                            and retroflexion views. Moderate Sedation:      Moderate (conscious) sedation was personally  administered by an       anesthesia professional. The following parameters were monitored: oxygen       saturation, heart rate, blood pressure, respiratory rate, EKG, adequacy       of pulmonary ventilation, and response to care. Recommendation:           - Patient has a contact number available for                            emergencies. The signs and symptoms of potential                            delayed complications were discussed with the                            patient. Return to normal activities tomorrow.                            Written discharge instructions were provided to the                            patient.                           - Advance diet as tolerated.                           - Continue present medications.                           - Repeat colonoscopy date to be determined after                            pending  pathology results are reviewed for                            surveillance.                           - Return to GI office (date not yet determined). Procedure Code(s):        --- Professional ---                           [phone], Colonoscopy, flexible; with removal of                            tumor(s), polyp(s), or other lesion(s) by snare                            technique Diagnosis Code(s):        --- Professional ---                           Z86.010, Personal history of colonic polyps                           D12.5, Benign neoplasm of sigmoid colon                           K57.30, Diverticulosis of large intestine without                            perforation or abscess without bleeding CPT copyright 2022 American [employer] Association. All rights reserved. The codes documented in this report are preliminary and upon coder review may  be revised to meet current compliance requirements. Gerrit Friends. Juquan Reznick, MD Gennette Pac, MD 07/30/2023 8:55:00 AM This report has been signed electronically. Number of Addenda: 0

## 2023-07-30 NOTE — Discharge Instructions (Signed)
  Colonoscopy Discharge Instructions  Read the instructions outlined below and refer to this sheet in the next few weeks. These discharge instructions provide you with general information on caring for yourself after you leave the hospital. Your doctor may also give you specific instructions. While your treatment has been planned according to the most current medical practices available, unavoidable complications occasionally occur. If you have any problems or questions after discharge, call Dr. Jena Gauss at [phone]. ACTIVITY You may resume your regular activity, but move at a slower pace for the next 24 hours.  Take frequent rest periods for the next 24 hours.  Walking will help get rid of the air and reduce the bloated feeling in your belly (abdomen).  No driving for 24 hours (because of the medicine (anesthesia) used during the test).   Do not sign any important legal documents or operate any machinery for 24 hours (because of the anesthesia used during the test).  NUTRITION Drink plenty of fluids.  You may resume your normal diet as instructed by your doctor.  Begin with a light meal and progress to your normal diet. Heavy or fried foods are harder to digest and may make you feel sick to your stomach (nauseated).  Avoid alcoholic beverages for 24 hours or as instructed.  MEDICATIONS You may resume your normal medications unless your doctor tells you otherwise.  WHAT YOU CAN EXPECT TODAY Some feelings of bloating in the abdomen.  Passage of more gas than usual.  Spotting of blood in your stool or on the toilet paper.  IF YOU HAD POLYPS REMOVED DURING THE COLONOSCOPY: No aspirin products for 7 days or as instructed.  No alcohol for 7 days or as instructed.  Eat a soft diet for the next 24 hours.  FINDING OUT THE RESULTS OF YOUR TEST Not all test results are available during your visit. If your test results are not back during the visit, make an appointment with your caregiver to find out the  results. Do not assume everything is normal if you have not heard from your caregiver or the medical facility. It is important for you to follow up on all of your test results.  SEEK IMMEDIATE MEDICAL ATTENTION IF: You have more than a spotting of blood in your stool.  Your belly is swollen (abdominal distention).  You are nauseated or vomiting.  You have a temperature over 101.  You have abdominal pain or discomfort that is severe or gets worse throughout the day.    1 polyp found and removed  Diverticulosis information and colon polyp information provided   further recommendations to follow pending review of pathology report   at patient request, called Elnita Maxwell at [phone]-reviewed findings and recommendations

## 2023-07-30 NOTE — H&P (Signed)
 @LOGO @   Primary Care Physician:  Elfredia Nevins, MD Primary Gastroenterologist:  Dr. Jena Gauss  Pre-Procedure History & Physical: HPI:  Dustin Jacobson is a 78 y.o. male here for for surveillance colonoscopy.  History of colonic adenoma in the distant past.  Diminutive polyp 2012; no polyps 2018.  Here for a surveillance examination.  Past Medical History:  Diagnosis Date   Adenomatous colon polyp 2002   Early cataract    left   GERD (gastroesophageal reflux disease)    History of kidney stones    Primary localized osteoarthritis of left knee    Primary localized osteoarthritis of left knee    Primary localized osteoarthritis of right knee 04/14/2018   S/P colonoscopy 05/15/2006   Dr Audelia Hives anal canal hemorrhoids, otherwise normal   S/P TKR (total knee replacement), left 04/14/2018   Schatzki's ring 05/15/2006   Last EGD Dr Candace Ramus-> dilated 44F, small hiatal hernia    Past Surgical History:  Procedure Laterality Date   BUNIONECTOMY     COLONOSCOPY  04/28/2011   Dr. Jena Gauss: colonic diverticulosis, single diminutive polyp (tubular adenoma), 5 year surveillance   COLONOSCOPY N/A 02/18/2017   Procedure: COLONOSCOPY;  Surgeon: Corbin Ade, MD;  Location: [facility];  Service: Endoscopy;  Laterality: N/A;  1:45pm   ESOPHAGOGASTRODUODENOSCOPY  2012   Dr. Jena Gauss: erosive reflux esophagitis, Schatzki's ring s/p dilation, H.pylori gastritis   ESOPHAGOGASTRODUODENOSCOPY N/A 02/18/2017   Procedure: ESOPHAGOGASTRODUODENOSCOPY (EGD);  Surgeon: Corbin Ade, MD;  Location: [facility];  Service: Endoscopy;  Laterality: N/A;   FINGER SURGERY Left    middle finger   INGUINAL HERNIA REPAIR     bilat   KNEE SURGERY     bilat   LITHOTRIPSY     x2   MALONEY DILATION N/A 02/18/2017   Procedure: MALONEY DILATION;  Surgeon: Corbin Ade, MD;  Location: [facility];  Service: Endoscopy;  Laterality: N/A;   TOTAL KNEE ARTHROPLASTY Left 07/28/2016   Procedure: TOTAL KNEE  ARTHROPLASTY;  Surgeon: Salvatore Marvel, MD;  Location: [facility] OR;  Service: Orthopedics;  Laterality: Left;   TOTAL KNEE ARTHROPLASTY Right 04/26/2018   Procedure: TOTAL KNEE ARTHROPLASTY;  Surgeon: Salvatore Marvel, MD;  Location: [facility] OR;  Service: Orthopedics;  Laterality: Right;   UMBILICAL HERNIA REPAIR      Prior to Admission medications   Medication Sig Start Date End Date Taking? Authorizing Provider  folic acid (FOLVITE) 1 MG tablet Take 1 mg by mouth daily.   Yes [provider]  glimepiride (AMARYL) 2 MG tablet Take 2 mg by mouth daily with breakfast.   Yes [provider]  meloxicam (MOBIC) 15 MG tablet Take 15 mg by mouth as needed.   Yes [provider]  gabapentin (NEURONTIN) 300 MG capsule 1 tablet before bed for nerve pain 04/27/18 12/30/19  Shepperson, Kirstin, PA-C    Allergies as of 06/24/2023 - Review Complete 06/02/2023  Allergen Reaction Noted   Relafen [nabumetone] Hives, Nausea Only, and Other (See Comments) 07/05/2014    Family History  Problem Relation Age of Onset   Heart failure Mother    Colon polyps Mother    Coronary artery disease Father    Lung cancer Brother    Anesthesia problems Neg Hx    Hypotension Neg Hx    Malignant hyperthermia Neg Hx    Pseudochol deficiency Neg Hx    Colon cancer Neg Hx     Social History   Socioeconomic History   Marital status: Married  Spouse name: Not on file   Number of children: 2   Years of education: Not on file   Highest education level: Not on file  Occupational History   Occupation: retired; Runner, broadcasting/film/video  Tobacco Use   Smoking status: Former    Types: Cigarettes   Smokeless tobacco: Never   Tobacco comments:    quit about 40 yrs ago  Vaping Use   Vaping status: Never Used  Substance and Sexual Activity   Alcohol use: Yes    Comment: once every one to two months   Drug use: No   Sexual activity: Yes    Birth control/protection: None  Other Topics Concern   Not on file  Social  History Narrative   Lives w/ wife         Social Drivers of Corporate investment banker Strain: Not on file  Food Insecurity: Not on file  Transportation Needs: Not on file  Physical Activity: Not on file  Stress: Not on file  Social Connections: Unknown (10/07/2021)   Received from [facility], [facility]   Social Network    Social Network: Not on file  Intimate Partner Violence: Unknown (08/29/2021)   Received from [employer], [facility]   HITS    Physically Hurt: Not on file    Insult or Talk Down To: Not on file    Threaten Physical Harm: Not on file    Scream or Curse: Not on file    Review of Systems: See HPI, otherwise negative ROS  Physical Exam: BP (!) 137/96   Pulse 61   Temp 97.7 F (36.5 C) (Oral)   Resp (!) 22   Ht 5\' 10"  (1.778 m)   Wt 77.1 kg   SpO2 98%   BMI 24.39 kg/m  General:   Alert,  Well-developed, well-nourished, pleasant and cooperative in NAD Neck:  Supple; no masses or thyromegaly. No significant cervical adenopathy. Lungs:  Clear throughout to auscultation.   No wheezes, crackles, or rhonchi. No acute distress. Heart:  Regular rate and rhythm; no murmurs, clicks, rubs,  or gallops. Abdomen: Non-distended, normal bowel sounds.  Soft and nontender without appreciable mass or hepatosplenomegaly.   Impression/Plan: 78 year old gentleman here for surveillance colonoscopy.  History of colonic adenoma. The risks, benefits, limitations, alternatives and imponderables have been reviewed with the patient. Questions have been answered. All parties are agreeable.       Notice: This dictation was prepared with Dragon dictation along with smaller phrase technology. Any transcriptional errors that result from this process are unintentional and may not be corrected upon review.

## 2023-07-30 NOTE — Transfer of Care (Signed)
 Immediate Anesthesia Transfer of Care Note  Patient: Dustin Jacobson  Procedure(s) Performed: COLONOSCOPY WITH PROPOFOL POLYPECTOMY  Patient Location: Endoscopy Unit  Anesthesia Type:General  Level of Consciousness: drowsy  Airway & Oxygen Therapy: Patient Spontanous Breathing  Post-op Assessment: Report given to RN and Post -op Vital signs reviewed and stable  Post vital signs: Reviewed and stable  Last Vitals:  Vitals Value Taken Time  BP    Temp    Pulse    Resp    SpO2      Last Pain:  Vitals:   07/30/23 0801  TempSrc:   PainSc: 0-No pain      Patients Stated Pain Goal: 9 (07/30/23 0710)  Complications: No notable events documented.

## 2023-07-30 NOTE — Anesthesia Postprocedure Evaluation (Signed)
 Anesthesia Post Note  Patient: Dustin Jacobson  Procedure(s) Performed: COLONOSCOPY WITH PROPOFOL POLYPECTOMY  Patient location during evaluation: Phase II Anesthesia Type: General Level of consciousness: awake Pain management: pain level controlled Vital Signs Assessment: post-procedure vital signs reviewed and stable Respiratory status: spontaneous breathing and respiratory function stable Cardiovascular status: blood pressure returned to baseline and stable Postop Assessment: no headache and no apparent nausea or vomiting Anesthetic complications: no Comments: Late entry   No notable events documented.   Last Vitals:  Vitals:   07/30/23 0710 07/30/23 0851  BP: (!) 137/96 (!) 98/54  Pulse: 61 62  Resp: (!) 22 16  Temp: 36.5 C 36.4 C  SpO2: 98% 95%    Last Pain:  Vitals:   07/30/23 0851  TempSrc: Axillary  PainSc: 0-No pain                 Windell Norfolk

## 2023-07-31 ENCOUNTER — Encounter (HOSPITAL_COMMUNITY): Payer: Self-pay | Admitting: Internal Medicine

## 2023-07-31 LAB — SURGICAL PATHOLOGY

## 2023-08-01 ENCOUNTER — Encounter: Payer: Self-pay | Admitting: Internal Medicine

## 2023-08-26 DIAGNOSIS — D0439 Carcinoma in situ of skin of other parts of face: Secondary | ICD-10-CM | POA: Diagnosis not present

## 2023-08-26 DIAGNOSIS — L570 Actinic keratosis: Secondary | ICD-10-CM | POA: Diagnosis not present

## 2023-08-26 DIAGNOSIS — D485 Neoplasm of uncertain behavior of skin: Secondary | ICD-10-CM | POA: Diagnosis not present

## 2023-09-17 DIAGNOSIS — D0439 Carcinoma in situ of skin of other parts of face: Secondary | ICD-10-CM | POA: Diagnosis not present

## 2023-11-09 DIAGNOSIS — H04123 Dry eye syndrome of bilateral lacrimal glands: Secondary | ICD-10-CM | POA: Diagnosis not present

## 2023-11-10 DIAGNOSIS — D0439 Carcinoma in situ of skin of other parts of face: Secondary | ICD-10-CM | POA: Diagnosis not present

## 2023-11-24 ENCOUNTER — Ambulatory Visit: Admitting: Nurse Practitioner

## 2023-12-08 DIAGNOSIS — D539 Nutritional anemia, unspecified: Secondary | ICD-10-CM | POA: Diagnosis not present

## 2023-12-08 DIAGNOSIS — Z299 Encounter for prophylactic measures, unspecified: Secondary | ICD-10-CM | POA: Diagnosis not present

## 2023-12-08 DIAGNOSIS — R252 Cramp and spasm: Secondary | ICD-10-CM | POA: Diagnosis not present

## 2023-12-08 DIAGNOSIS — R7303 Prediabetes: Secondary | ICD-10-CM | POA: Diagnosis not present

## 2023-12-08 DIAGNOSIS — E559 Vitamin D deficiency, unspecified: Secondary | ICD-10-CM | POA: Diagnosis not present

## 2023-12-08 DIAGNOSIS — M19042 Primary osteoarthritis, left hand: Secondary | ICD-10-CM | POA: Diagnosis not present

## 2024-01-17 ENCOUNTER — Emergency Department (HOSPITAL_COMMUNITY)
Admission: EM | Admit: 2024-01-17 | Discharge: 2024-01-17 | Disposition: A | Discharge status: Home / Self Care | Attending: Student | Admitting: Student

## 2024-01-17 ENCOUNTER — Emergency Department (HOSPITAL_COMMUNITY)

## 2024-01-17 ENCOUNTER — Other Ambulatory Visit: Payer: Self-pay

## 2024-01-17 ENCOUNTER — Encounter (HOSPITAL_COMMUNITY): Payer: Self-pay | Admitting: Emergency Medicine

## 2024-01-17 DIAGNOSIS — R109 Unspecified abdominal pain: Secondary | ICD-10-CM | POA: Diagnosis not present

## 2024-01-17 DIAGNOSIS — K449 Diaphragmatic hernia without obstruction or gangrene: Secondary | ICD-10-CM | POA: Diagnosis not present

## 2024-01-17 DIAGNOSIS — K5730 Diverticulosis of large intestine without perforation or abscess without bleeding: Secondary | ICD-10-CM | POA: Diagnosis not present

## 2024-01-17 DIAGNOSIS — M48061 Spinal stenosis, lumbar region without neurogenic claudication: Secondary | ICD-10-CM | POA: Diagnosis not present

## 2024-01-17 DIAGNOSIS — M47816 Spondylosis without myelopathy or radiculopathy, lumbar region: Secondary | ICD-10-CM | POA: Diagnosis not present

## 2024-01-17 DIAGNOSIS — N132 Hydronephrosis with renal and ureteral calculous obstruction: Secondary | ICD-10-CM | POA: Insufficient documentation

## 2024-01-17 DIAGNOSIS — N200 Calculus of kidney: Secondary | ICD-10-CM

## 2024-01-17 DIAGNOSIS — M51369 Other intervertebral disc degeneration, lumbar region without mention of lumbar back pain or lower extremity pain: Secondary | ICD-10-CM | POA: Diagnosis not present

## 2024-01-17 LAB — HEPATIC FUNCTION PANEL
ALT: 14 U/L (ref 0–44)
AST: 15 U/L (ref 15–41)
Albumin: 3.6 g/dL (ref 3.5–5.0)
Alkaline Phosphatase: 63 U/L (ref 38–126)
Bilirubin, Direct: 0.1 mg/dL (ref 0.0–0.2)
Indirect Bilirubin: 0.8 mg/dL (ref 0.3–0.9)
Total Bilirubin: 0.9 mg/dL (ref 0.0–1.2)
Total Protein: 7 g/dL (ref 6.5–8.1)

## 2024-01-17 LAB — CBC WITH DIFFERENTIAL/PLATELET
Abs Immature Granulocytes: 0.03 K/uL (ref 0.00–0.07)
Basophils Absolute: 0 K/uL (ref 0.0–0.1)
Basophils Relative: 0 %
Eosinophils Absolute: 0 K/uL (ref 0.0–0.5)
Eosinophils Relative: 0 %
HCT: 40.8 % (ref 39.0–52.0)
Hemoglobin: 13.8 g/dL (ref 13.0–17.0)
Immature Granulocytes: 0 %
Lymphocytes Relative: 18 %
Lymphs Abs: 2.3 K/uL (ref 0.7–4.0)
MCH: 32.3 pg (ref 26.0–34.0)
MCHC: 33.8 g/dL (ref 30.0–36.0)
MCV: 95.6 fL (ref 80.0–100.0)
Monocytes Absolute: 1.2 K/uL — ABNORMAL HIGH (ref 0.1–1.0)
Monocytes Relative: 10 %
Neutro Abs: 8.6 K/uL — ABNORMAL HIGH (ref 1.7–7.7)
Neutrophils Relative %: 72 %
Platelets: 204 K/uL (ref 150–400)
RBC: 4.27 MIL/uL (ref 4.22–5.81)
RDW: 13.6 % (ref 11.5–15.5)
WBC: 12.2 K/uL — ABNORMAL HIGH (ref 4.0–10.5)
nRBC: 0 % (ref 0.0–0.2)

## 2024-01-17 LAB — URINALYSIS, ROUTINE W REFLEX MICROSCOPIC
Bacteria, UA: NONE SEEN
Bilirubin Urine: NEGATIVE
Glucose, UA: NEGATIVE mg/dL
Ketones, ur: 5 mg/dL — AB
Leukocytes,Ua: NEGATIVE
Nitrite: NEGATIVE
Protein, ur: NEGATIVE mg/dL
Specific Gravity, Urine: 1.013 (ref 1.005–1.030)
pH: 5 (ref 5.0–8.0)

## 2024-01-17 LAB — BASIC METABOLIC PANEL WITH GFR
Anion gap: 12 (ref 5–15)
BUN: 27 mg/dL — ABNORMAL HIGH (ref 8–23)
CO2: 20 mmol/L — ABNORMAL LOW (ref 22–32)
Calcium: 8.6 mg/dL — ABNORMAL LOW (ref 8.9–10.3)
Chloride: 100 mmol/L (ref 98–111)
Creatinine, Ser: 1.77 mg/dL — ABNORMAL HIGH (ref 0.61–1.24)
GFR, Estimated: 39 mL/min — ABNORMAL LOW (ref >60)
Glucose, Bld: 142 mg/dL — ABNORMAL HIGH (ref 70–99)
Potassium: 4.3 mmol/L (ref 3.5–5.1)
Sodium: 132 mmol/L — ABNORMAL LOW (ref 135–145)

## 2024-01-17 MED ORDER — SODIUM CHLORIDE 0.9 % IV BOLUS
500.0000 mL | Freq: Once | INTRAVENOUS | Status: AC
  Administered 2024-01-17: 500 mL via INTRAVENOUS

## 2024-01-17 MED ORDER — TAMSULOSIN HCL 0.4 MG PO CAPS: 0.4000 mg | ORAL_CAPSULE | Freq: Every day | ORAL | 0 refills | Status: DC

## 2024-01-17 MED ORDER — HYDROCODONE-ACETAMINOPHEN 5-325 MG PO TABS: 1.0000 | ORAL_TABLET | Freq: Four times a day (QID) | ORAL | 0 refills | Status: AC | PRN

## 2024-01-17 MED ORDER — ONDANSETRON 4 MG PO TBDP: 4.0000 mg | ORAL_TABLET | Freq: Three times a day (TID) | ORAL | 0 refills | Status: DC | PRN

## 2024-01-17 MED ORDER — HYDROCODONE-ACETAMINOPHEN 5-325 MG PO TABS: 1.0000 | ORAL_TABLET | Freq: Four times a day (QID) | ORAL | 0 refills | Status: DC | PRN

## 2024-01-17 MED ORDER — ONDANSETRON HCL 4 MG/2ML IJ SOLN
4.0000 mg | Freq: Once | INTRAMUSCULAR | Status: AC
Start: 2024-01-17 — End: 2024-01-17
  Administered 2024-01-17: 4 mg via INTRAVENOUS
  Filled 2024-01-17: qty 2

## 2024-01-17 MED ORDER — MORPHINE SULFATE (PF) 4 MG/ML IV SOLN
4.0000 mg | Freq: Once | INTRAVENOUS | Status: AC
  Administered 2024-01-17: 4 mg via INTRAVENOUS
  Filled 2024-01-17: qty 1

## 2024-01-17 MED ORDER — HYDROCODONE-ACETAMINOPHEN 5-325 MG PO TABS
1.0000 | ORAL_TABLET | Freq: Once | ORAL | Status: AC
  Administered 2024-01-17: 1 via ORAL
  Filled 2024-01-17: qty 1

## 2024-01-17 NOTE — Discharge Instructions (Addendum)
 Please call your urologist to schedule follow-up.  Alternate Tylenol  and ibuprofen for pain control.  Take oxycodone  for breakthrough pain.  Take tamsulosin .  Make sure staying well-hydrated.  Return to emergency room with new or worsening symptoms.  Call urology to schedule follow-up.

## 2024-01-17 NOTE — ED Notes (Signed)
 Pt attempted to give urine specimen; unable to at this time.

## 2024-01-17 NOTE — ED Provider Notes (Signed)
 [place] EMERGENCY DEPARTMENT AT [facility] Provider Note   CSN: [id number] Arrival date & time: 01/17/24  2000     Patient presents with: Flank Pain   Dustin Jacobson is a 78 y.o. male with past medical history of GERD, kidney stones, history of colon polyp presenting to emergency room with complaint of left flank pain.  Patient reports that this started yesterday after riding a horse and has gotten gradually worse.  Pain is associated with nausea and vomiting.  Had some tamsulosin  from prior kidney stone at home and tried taking this.  Reports that this feels like a kidney stone.  Has needed lithotripsy in the past several years ago.  Denies any numbness or tingling in his groin, loss of bowel or bladder or urinary retention, fever, history of IV drug use.    Flank Pain       Prior to Admission medications   Medication Sig Start Date End Date Taking? Authorizing Provider  folic acid  (FOLVITE ) 1 MG tablet Take 1 mg by mouth daily.    [provider]  glimepiride (AMARYL) 2 MG tablet Take 2 mg by mouth daily with breakfast.    [provider]  meloxicam  (MOBIC ) 15 MG tablet Take 15 mg by mouth as needed.    [provider]  gabapentin  (NEURONTIN ) 300 MG capsule 1 tablet before bed for nerve pain 04/27/18 12/30/19  Shepperson, Kirstin, PA-C    Allergies: Relafen [nabumetone]    Review of Systems  Genitourinary:  Positive for flank pain.    Updated Vital Signs BP (!) 143/76 (BP Location: Right Arm)   Pulse 63   Temp 98.2 F (36.8 C) (Oral)   Resp 20   Ht 5' 10 (1.778 m)   Wt 77.1 kg   SpO2 100%   BMI 24.39 kg/m   Physical Exam Vitals and nursing note reviewed.  Constitutional:      General: He is not in acute distress.    Appearance: He is not toxic-appearing.  HENT:     Head: Normocephalic and atraumatic.  Eyes:     General: No scleral icterus.    Conjunctiva/sclera: Conjunctivae normal.  Cardiovascular:     Rate and  Rhythm: Normal rate and regular rhythm.     Pulses: Normal pulses.     Heart sounds: Normal heart sounds.  Pulmonary:     Effort: Pulmonary effort is normal. No respiratory distress.     Breath sounds: Normal breath sounds.  Abdominal:     General: Abdomen is flat. Bowel sounds are normal.     Palpations: Abdomen is soft.     Tenderness: There is abdominal tenderness. There is left CVA tenderness. There is no right CVA tenderness.     Comments: Mild reproducible left lower quadrant tenderness to palpation.  Musculoskeletal:     Comments: No cervical thoracic or midline tenderness.  No step-off or deformity.  No bruising.  No rash.  Skin:    General: Skin is warm and dry.     Findings: No lesion.  Neurological:     General: No focal deficit present.     Mental Status: He is alert and oriented to person, place, and time. Mental status is at baseline.     Comments: Lower extremity sensation and strength intact.     (all labs ordered are listed, but only abnormal results are displayed) Labs Reviewed  URINALYSIS, ROUTINE W REFLEX MICROSCOPIC - Abnormal; Notable for the following components:  Result Value   Hgb urine dipstick SMALL (*)    Ketones, ur 5 (*)    All other components within normal limits  BASIC METABOLIC PANEL WITH GFR - Abnormal; Notable for the following components:   Sodium 132 (*)    CO2 20 (*)    Glucose, Bld 142 (*)    BUN 27 (*)    Creatinine, Ser 1.77 (*)    Calcium 8.6 (*)    GFR, Estimated 39 (*)    All other components within normal limits  CBC WITH DIFFERENTIAL/PLATELET - Abnormal; Notable for the following components:   WBC 12.2 (*)    Neutro Abs 8.6 (*)    Monocytes Absolute 1.2 (*)    All other components within normal limits  HEPATIC FUNCTION PANEL    EKG: None  Radiology: CT L-SPINE NO CHARGE Result Date: 01/17/2024 CLINICAL DATA:  Left-sided flank pain starting yesterday. EXAM: CT LUMBAR SPINE WITHOUT CONTRAST TECHNIQUE: Multidetector  CT imaging of the lumbar spine was performed without intravenous contrast administration. Multiplanar CT image reconstructions were also generated. RADIATION DOSE REDUCTION: This exam was performed according to the departmental dose-optimization program which includes automated exposure control, adjustment of the mA and/or kV according to patient size and/or use of iterative reconstruction technique. COMPARISON:  MRI lumbar spine 11/30/2015 FINDINGS: Segmentation: 5 lumbar type vertebral bodies. Alignment: Normal alignment. Vertebrae: No vertebral compression deformities. No focal bone lesion or bone destruction. Paraspinal and other soft tissues: No abnormal paraspinal soft tissue mass or infiltration. See additional report of CT abdomen and pelvis for retroperitoneal structures. Disc levels: Degenerative changes throughout the lumbar spine with narrowed interspaces and endplate osteophyte formation throughout. Degenerative changes in the facet joints. Gas in the intervertebral discs at L2-3, L4-5, and L5-S1 levels consistent with degenerative disc disease. Gas in the posterior canal at L4-5 and L5-S1 likely representing bulging disc material. Bone and soft tissue encroachment on the central canal demonstrating moderate to severe stenosis at L2-3, severe stenosis at L3-4, moderate to severe stenosis at L4-5, and effacement of the right lateral recess at L5-S1. Degenerative disc disease is similar to the previous MRI, allowing for differences in technique. IMPRESSION: 1. Diffuse multilevel degenerative disc disease. 2. Severe stenosis suggested at L3-4 with moderate to severe stenosis suggested at L2-3 and L4-5 levels. 3. No acute displaced fractures are identified. Electronically Signed   By: Elsie Gravely M.D.   On: 01/17/2024 21:25   CT Renal Stone Study Result Date: 01/17/2024 CLINICAL DATA:  Abdominal and flank pain with stone suspected. Left flank pain started yesterday progressively worsening. Nausea.  History of kidney stones. EXAM: CT ABDOMEN AND PELVIS WITHOUT CONTRAST TECHNIQUE: Multidetector CT imaging of the abdomen and pelvis was performed following the standard protocol without IV contrast. RADIATION DOSE REDUCTION: This exam was performed according to the departmental dose-optimization program which includes automated exposure control, adjustment of the mA and/or kV according to patient size and/or use of iterative reconstruction technique. COMPARISON:  10/24/2015 FINDINGS: Lower chest: Focal scarring or atelectasis in the lung bases, greater on the left. Small esophageal hiatal hernia. Hepatobiliary: No focal liver abnormality is seen. No gallstones, gallbladder wall thickening, or biliary dilatation. Pancreas: Unremarkable. No pancreatic ductal dilatation or surrounding inflammatory changes. Spleen: Normal in size without focal abnormality. Adrenals/Urinary Tract: No adrenal gland nodules. 5 mm stone in the proximal left ureter at the ureteropelvic junction. There is proximal hydronephrosis with stranding around the left kidney. Additional 3 mm intrarenal stone on the left. Right kidney, right  ureter, and the bladder are normal. Stomach/Bowel: Stomach, small bowel, and colon are not abnormally distended. Small bowel are mostly decompressed. Scattered stool throughout the colon. No wall thickening or inflammatory stranding. Diverticulosis of the colon without evidence of acute diverticulitis. Appendix is normal. Vascular/Lymphatic: Aortic atherosclerosis. No enlarged abdominal or pelvic lymph nodes. Reproductive: Prostate is unremarkable. Other: No free air or free fluid in the abdomen. Abdominal wall musculature appears intact. Musculoskeletal: Degenerative changes in the spine. Degenerative disc disease at L4-5 and L5-S1. No acute bony abnormalities. IMPRESSION: 1. 5 mm stone in the proximal left ureter with moderate proximal obstruction. 2. Additional 3 mm nonobstructing stone in the left kidney. 3.  Aortic atherosclerosis. 4. Small esophageal hiatal hernia. Electronically Signed   By: Elsie Gravely M.D.   On: 01/17/2024 21:21     Procedures   Medications Ordered in the ED  HYDROcodone -acetaminophen  (NORCO/VICODIN) 5-325 MG per tablet 1 tablet (has no administration in time range)  sodium chloride  0.9 % bolus 500 mL (0 mLs Intravenous Stopped 01/17/24 2133)  morphine  (PF) 4 MG/ML injection 4 mg (4 mg Intravenous Given 01/17/24 2033)  ondansetron  (ZOFRAN ) injection 4 mg (4 mg Intravenous Given 01/17/24 2033)                                    Medical Decision Making Amount and/or Complexity of Data Reviewed Labs: ordered. Radiology: ordered.  Risk Prescription drug management.   This patient presents to the ED for concern of abdominal pain, this involves an extensive number of treatment options, and is a complaint that carries with it a high risk of complications and morbidity.  The differential diagnosis includes cholecystitis, AAA, appendicitis, renal stone, UTI   Co morbidities that complicate the patient evaluation  History of kidney stone    Lab Tests:  I personally interpreted labs.  The pertinent results include:   Leukocytosis of 12.2, hemoglobin is 13.8   Imaging Studies ordered:  I ordered imaging studies including CT renal study as stone is favored pathology I independently visualized and interpreted imaging which showed 5 mm proximal left ureter stone with moderate proximal obstruction.  Also an additional 3 mm nonobstructing stone in left kidney.  L-spine shows significant degenerative disc disease. I agree with the radiologist interpretation   Cardiac Monitoring: / EKG:  The patient was maintained on a cardiac monitor.     Problem List / ED Course / Critical interventions / Medication management  Patient presents with left flank pain.  This has been ongoing for 1 day.  It is associated with nausea and vomiting.  Vomit is nonbilious nonbloody.   Patient denies any change in bowel movements.  No red flag symptoms associated with back pain.  On arrival hemodynamically stable and well-appearing.  Afebrile.  Patient does have some reproducible left lower quadrant pain on exam as well as left-sided CVA tenderness.  Labs show a mild leukocytosis.  Creatinine is 1.7 unfortunately no recent labs to compare this to regardless patient was rehydrated with fluids here.  Urine is negative for UTI.  CT scan shows 5 mm stone in the proximal left ureter which is consistent with patient's symptoms.  Patient is feeling better after morphine  and Zofran .  Given 1 dose of Norco here.  Tolerating oral intake and feeling better. I ordered medication including morphine , Zofran , normal saline. Reevaluation of the patient after these medicines showed that the patient improved I have reviewed  the patients home medicines and have made adjustments as needed Ambulatory referral to urology.  He understands he needs to have kidney function rechecked in 2 to 3 days.  He will stay well-hydrated.  Feel appropriate for discharge with outpatient follow-up.      Final diagnoses:  Kidney stone    ED Discharge Orders          Ordered    HYDROcodone -acetaminophen  (NORCO/VICODIN) 5-325 MG tablet  Every 6 hours PRN,   Status:  Discontinued        01/17/24 2214    tamsulosin  (FLOMAX ) 0.4 MG CAPS capsule  Daily after supper        01/17/24 2214    ondansetron  (ZOFRAN -ODT) 4 MG disintegrating tablet  Every 8 hours PRN        01/17/24 2214    Ambulatory referral to Urology        01/17/24 2215    HYDROcodone -acetaminophen  (NORCO/VICODIN) 5-325 MG tablet  Every 6 hours PRN        01/17/24 2227               Dustin Jacobson, Warren SAILOR, PA-C 01/17/24 2230    Albertina Dixon, MD 01/18/24 0104

## 2024-01-17 NOTE — ED Triage Notes (Signed)
 Pt with c/o L sided flank pain that started yesterday and has progressively gotten worse. Pt also c/o nausea with the pain. Pt with hx of kidney stones.

## 2024-01-18 ENCOUNTER — Other Ambulatory Visit: Payer: Self-pay

## 2024-01-18 ENCOUNTER — Inpatient Hospital Stay (HOSPITAL_COMMUNITY): Admission: RE | Admit: 2024-01-18 | Source: Ambulatory Visit

## 2024-01-18 ENCOUNTER — Encounter: Payer: Self-pay | Admitting: Urology

## 2024-01-18 ENCOUNTER — Ambulatory Visit: Admitting: Urology

## 2024-01-18 ENCOUNTER — Ambulatory Visit (HOSPITAL_COMMUNITY)
Admission: RE | Admit: 2024-01-18 | Discharge: 2024-01-18 | Disposition: A | Discharge status: Home / Self Care | Source: Ambulatory Visit | Attending: Urology | Admitting: Urology

## 2024-01-18 VITALS — BP 135/68 | HR 45

## 2024-01-18 DIAGNOSIS — R109 Unspecified abdominal pain: Secondary | ICD-10-CM | POA: Diagnosis not present

## 2024-01-18 DIAGNOSIS — N2889 Other specified disorders of kidney and ureter: Secondary | ICD-10-CM | POA: Diagnosis not present

## 2024-01-18 DIAGNOSIS — N200 Calculus of kidney: Secondary | ICD-10-CM | POA: Diagnosis not present

## 2024-01-18 LAB — URINALYSIS, ROUTINE W REFLEX MICROSCOPIC
Bilirubin, UA: NEGATIVE
Glucose, UA: NEGATIVE
Ketones, UA: NEGATIVE
Leukocytes,UA: NEGATIVE
Nitrite, UA: NEGATIVE
Protein,UA: NEGATIVE
Specific Gravity, UA: 1.01 (ref 1.005–1.030)
Urobilinogen, Ur: 0.2 mg/dL (ref 0.2–1.0)
pH, UA: 6 (ref 5.0–7.5)

## 2024-01-18 LAB — MICROSCOPIC EXAMINATION: Bacteria, UA: NONE SEEN

## 2024-01-18 MED ORDER — OXYCODONE-ACETAMINOPHEN 5-325 MG PO TABS: 1.0000 | ORAL_TABLET | ORAL | 0 refills | Status: DC | PRN

## 2024-01-18 MED FILL — Hydrocodone-Acetaminophen Tab 5-325 MG: ORAL | Qty: 6 | Status: AC

## 2024-01-18 NOTE — Progress Notes (Signed)
 01/18/2024 2:04 PM   Dustin Jacobson 1945/09/02 [id number]  Referring provider: Bertell Satterfield, MD [address] [place],  [place] [address]  nephrolithiasis   HPI: [name] is a 78yo here for evaluation of nephrolithiasis. He developed left flank pain Saturday and presented to the ER yesterday and was diagnosed with a left 5mm UPJ calculus. This is his 5-6th stone event. He has had ESWL in the past  His pain is sharp, intermittent, mild to moderate and nonradiaiting. He has nausea and vomiting.    PMH: Past Medical History:  Diagnosis Date   Adenomatous colon polyp 2002   Early cataract    left   GERD (gastroesophageal reflux disease)    History of kidney stones    Primary localized osteoarthritis of left knee    Primary localized osteoarthritis of left knee    Primary localized osteoarthritis of right knee 04/14/2018   S/P colonoscopy 05/15/2006   Dr Trudie anal canal hemorrhoids, otherwise normal   S/P TKR (total knee replacement), left 04/14/2018   Schatzki's ring 05/15/2006   Last EGD Dr Rourk-> dilated 62F, small hiatal hernia    Surgical History: Past Surgical History:  Procedure Laterality Date   BUNIONECTOMY     COLONOSCOPY  04/28/2011   Dr. Shaaron: colonic diverticulosis, single diminutive polyp (tubular adenoma), 5 year surveillance   COLONOSCOPY N/A 02/18/2017   Procedure: COLONOSCOPY;  Surgeon: Shaaron Lamar HERO, MD;  [facility]: [facility];  Service: Endoscopy;  Laterality: N/A;  1:45pm   COLONOSCOPY WITH PROPOFOL  N/A 07/30/2023   Procedure: COLONOSCOPY WITH PROPOFOL ;  Surgeon: Shaaron Lamar HERO, MD;  Location: [facility];  Service: Endoscopy;  Laterality: N/A;  8:00 am, asa 2   ESOPHAGOGASTRODUODENOSCOPY  2012   Dr. Shaaron: erosive reflux esophagitis, Schatzki's ring s/p dilation, H.pylori gastritis   ESOPHAGOGASTRODUODENOSCOPY N/A 02/18/2017   Procedure: ESOPHAGOGASTRODUODENOSCOPY (EGD);  Surgeon: Shaaron Lamar HERO, MD;  [facility];  Service: Endoscopy;  Laterality: N/A;   FINGER SURGERY Left    middle finger   INGUINAL HERNIA REPAIR     bilat   KNEE SURGERY     bilat   LITHOTRIPSY     x2   MALONEY DILATION N/A 02/18/2017   Procedure: MALONEY DILATION;  Surgeon: Shaaron Lamar HERO, MD;  Location: [facility];  Service: Endoscopy;  Laterality: N/A;   POLYPECTOMY  07/30/2023   Procedure: POLYPECTOMY;  Surgeon: Shaaron Lamar HERO, MD;  Location: [facility];  Service: Endoscopy;;   TOTAL KNEE ARTHROPLASTY Left 07/28/2016   Procedure: TOTAL KNEE ARTHROPLASTY;  Surgeon: Lamar Millman, MD;  Location: [facility] OR;  Service: Orthopedics;  Laterality: Left;   TOTAL KNEE ARTHROPLASTY Right 04/26/2018   Procedure: TOTAL KNEE ARTHROPLASTY;  Surgeon: Millman Lamar, MD;  Location: [facility] OR;  Service: Orthopedics;  Laterality: Right;   UMBILICAL HERNIA REPAIR      Home Medications:  Allergies as of 01/18/2024       Reactions   Relafen [nabumetone] Hives, Nausea Only, Other (See Comments)   Dizziness,fainting, itching        Medication List        Accurate as of January 18, 2024  2:04 PM. If you have any questions, ask your nurse or doctor.          folic acid  1 MG tablet Commonly known as: FOLVITE  Take 1 mg by mouth daily.   glimepiride 2 MG tablet Commonly known as: AMARYL Take 2 mg by mouth daily with breakfast.  HYDROcodone -acetaminophen  5-325 MG tablet Commonly known as: NORCO/VICODIN Take 1 tablet by mouth every 6 (six) hours as needed for severe pain (pain score 7-10).   meloxicam  15 MG tablet Commonly known as: MOBIC  Take 15 mg by mouth as needed.   ondansetron  4 MG disintegrating tablet Commonly known as: ZOFRAN -ODT Take 1 tablet (4 mg total) by mouth every 8 (eight) hours as needed for nausea or vomiting.   tamsulosin  0.4 MG Caps capsule Commonly known as: FLOMAX  Take 1 capsule (0.4 mg total) by mouth daily after supper.        Allergies:  Allergies  Allergen Reactions   Relafen  [Nabumetone] Hives, Nausea Only and Other (See Comments)    Dizziness,fainting, itching     Family History: Family History  Problem Relation Age of Onset   Heart failure Mother    Colon polyps Mother    Coronary artery disease Father    Lung cancer Brother    Anesthesia problems Neg Hx    Hypotension Neg Hx    Malignant hyperthermia Neg Hx    Pseudochol deficiency Neg Hx    Colon cancer Neg Hx     Social History:  reports that he has quit smoking. His smoking use included cigarettes. He has never used smokeless tobacco. He reports current alcohol use. He reports that he does not use drugs.  ROS: All other review of systems were reviewed and are negative except what is noted above in HPI  Physical Exam: BP 135/68   Pulse (!) 45   Constitutional:  Alert and oriented, No acute distress. HEENT: [place] AT, moist mucus membranes.  Trachea midline, no masses. Cardiovascular: No clubbing, cyanosis, or edema. Respiratory: Normal respiratory effort, no increased work of breathing. GI: Abdomen is soft, nontender, nondistended, no abdominal masses GU: No CVA tenderness.  Lymph: No cervical or inguinal lymphadenopathy. Skin: No rashes, bruises or suspicious lesions. Neurologic: Grossly intact, no focal deficits, moving all 4 extremities. Psychiatric: Normal mood and affect.  Laboratory Data: Lab Results  Component Value Date   WBC 12.2 (H) 01/17/2024   HGB 13.8 01/17/2024   HCT 40.8 01/17/2024   MCV 95.6 01/17/2024   PLT 204 01/17/2024    Lab Results  Component Value Date   CREATININE 1.77 (H) 01/17/2024    No results found for: PSA  No results found for: TESTOSTERONE  No results found for: HGBA1C  Urinalysis    Component Value Date/Time   COLORURINE YELLOW 01/17/2024 2141   APPEARANCEUR CLEAR 01/17/2024 2141   LABSPEC 1.013 01/17/2024 2141   PHURINE 5.0 01/17/2024 2141   GLUCOSEU NEGATIVE 01/17/2024 2141   HGBUR SMALL (A) 01/17/2024 2141   BILIRUBINUR  NEGATIVE 01/17/2024 2141   BILIRUBINUR negative 12/30/2019 1856   KETONESUR 5 (A) 01/17/2024 2141   PROTEINUR NEGATIVE 01/17/2024 2141   UROBILINOGEN 0.2 12/30/2019 1856   NITRITE NEGATIVE 01/17/2024 2141   LEUKOCYTESUR NEGATIVE 01/17/2024 2141    Lab Results  Component Value Date   BACTERIA NONE SEEN 01/17/2024    Pertinent Imaging: CT stone study yesterday and KUb today: Images reviewed and discussed with the procedure  No results found for this or any previous visit.  No results found for this or any previous visit.  No results found for this or any previous visit.  No results found for this or any previous visit.  No results found for this or any previous visit.  No results found for this or any previous visit.  No results found for this or any  previous visit.  Results for orders placed during the hospital encounter of 01/17/24  CT Renal Stone Study  Narrative CLINICAL DATA:  Abdominal and flank pain with stone suspected. Left flank pain started yesterday progressively worsening. Nausea. History of kidney stones.  EXAM: CT ABDOMEN AND PELVIS WITHOUT CONTRAST  TECHNIQUE: Multidetector CT imaging of the abdomen and pelvis was performed following the standard protocol without IV contrast.  RADIATION DOSE REDUCTION: This exam was performed according to the departmental dose-optimization program which includes automated exposure control, adjustment of the mA and/or kV according to patient size and/or use of iterative reconstruction technique.  COMPARISON:  10/24/2015  FINDINGS: Lower chest: Focal scarring or atelectasis in the lung bases, greater on the left. Small esophageal hiatal hernia.  Hepatobiliary: No focal liver abnormality is seen. No gallstones, gallbladder wall thickening, or biliary dilatation.  Pancreas: Unremarkable. No pancreatic ductal dilatation or surrounding inflammatory changes.  Spleen: Normal in size without focal  abnormality.  Adrenals/Urinary Tract: No adrenal gland nodules. 5 mm stone in the proximal left ureter at the ureteropelvic junction. There is proximal hydronephrosis with stranding around the left kidney. Additional 3 mm intrarenal stone on the left. Right kidney, right ureter, and the bladder are normal.  Stomach/Bowel: Stomach, small bowel, and colon are not abnormally distended. Small bowel are mostly decompressed. Scattered stool throughout the colon. No wall thickening or inflammatory stranding. Diverticulosis of the colon without evidence of acute diverticulitis. Appendix is normal.  Vascular/Lymphatic: Aortic atherosclerosis. No enlarged abdominal or pelvic lymph nodes.  Reproductive: Prostate is unremarkable.  Other: No free air or free fluid in the abdomen. Abdominal wall musculature appears intact.  Musculoskeletal: Degenerative changes in the spine. Degenerative disc disease at L4-5 and L5-S1. No acute bony abnormalities.  IMPRESSION: 1. 5 mm stone in the proximal left ureter with moderate proximal obstruction. 2. Additional 3 mm nonobstructing stone in the left kidney. 3. Aortic atherosclerosis. 4. Small esophageal hiatal hernia.   Electronically Signed By: Elsie Gravely M.D. On: 01/17/2024 21:21   Assessment & Plan:    1. Nephrolithiasis (Primary) -We discussed the management of kidney stones. These options include observation, ureteroscopy, shockwave lithotripsy (ESWL) and percutaneous nephrolithotomy (PCNL). We discussed which options are relevant to the patient's stone(s). We discussed the natural history of kidney stones as well as the complications of untreated stones and the impact on quality of life without treatment as well as with each of the above listed treatments. We also discussed the efficacy of each treatment in its ability to clear the stone burden. With any of these management options I discussed the signs and symptoms of infection and the  need for emergent treatment should these be experienced. For each option we discussed the ability of each procedure to clear the patient of their stone burden.   For observation I described the risks which include but are not limited to silent renal damage, life-threatening infection, need for emergent surgery, failure to pass stone and pain.   For ureteroscopy I described the risks which include bleeding, infection, damage to contiguous structures, positioning injury, ureteral stricture, ureteral avulsion, ureteral injury, need for prolonged ureteral stent, inability to perform ureteroscopy, need for an interval procedure, inability to clear stone burden, stent discomfort/pain, heart attack, stroke, pulmonary embolus and the inherent risks with general anesthesia.   For shockwave lithotripsy I described the risks which include arrhythmia, kidney contusion, kidney hemorrhage, need for transfusion, pain, inability to adequately break up stone, inability to pass stone fragments, Steinstrasse, infection associated with  obstructing stones, need for alternate surgical procedure, need for repeat shockwave lithotripsy, MI, CVA, PE and the inherent risks with anesthesia/conscious sedation.   For PCNL I described the risks including positioning injury, pneumothorax, hydrothorax, need for chest tube, inability to clear stone burden, renal laceration, arterial venous fistula or malformation, need for embolization of kidney, loss of kidney or renal function, need for repeat procedure, need for prolonged nephrostomy tube, ureteral avulsion, MI, CVA, PE and the inherent risks of general anesthesia.   - The patient would like to proceed with left ESWL   - Urinalysis, Routine w reflex microscopic   No follow-ups on file.  [name], MD  [facility] [place]

## 2024-01-18 NOTE — Patient Instructions (Signed)
 ESWL for Kidney Stones  Extracorporeal shock wave lithotripsy (ESWL) is a treatment that can help break up kidney stones that are too large to pass on their own.  This is a nonsurgical procedure that breaks up a kidney stone with shock waves. These shock waves pass through your body and focus on the kidney stone. They cause the kidney stone to break into smaller pieces (fragments) while it is still in the urinary tract. The fragments of stone can pass more easily out of your body in the pee (urine). Tell a health care provider about: Any allergies you have. All medicines you are taking, including vitamins, herbs, eye drops, creams, and over-the-counter medicines. Any problems you or family members have had with anesthetic medicines. Any bleeding problems you have. Any surgeries you've had. Any medical conditions you have. Whether you're pregnant or may be pregnant. What are the risks? Your health care provider will talk with you about risks. These may include: Infection. Bleeding from the kidney. Bruising of the kidney or skin. Scarring of the kidney. This can lead to: Increased blood pressure. Poor kidney function. Return (recurrence) of kidney stones. Damage to other structures or organs. This may include the liver, colon, spleen, or pancreas. Blockage (obstruction) of the tube that carries pee from the kidney to the bladder (ureter). Failure of the kidney stone to break into fragments. What happens before the procedure? When to stop eating and drinking Follow instructions from your health care provider about what you may eat and drink. These may include: 8 hours before your procedure Stop eating most foods. Do not eat meat, fried foods, or fatty foods. Eat only light foods, such as toast or crackers. All liquids are okay except energy drinks and alcohol. 6 hours before your procedure Stop eating. Drink only clear liquids, such as water, clear fruit juice, black coffee, plain tea,  and sports drinks. Do not drink energy drinks or alcohol. 2 hours before your procedure Stop drinking all liquids. You may be allowed to take medicines with small sips of water. If you do not follow your health care provider's instructions, your procedure may be delayed or canceled. Medicines Ask your health care provider about: Changing or stopping your regular medicines. These include any diabetes medicines or blood thinners you take. Taking medicines such as aspirin and ibuprofen. These medicines can thin your blood. Do not take them unless your health care provider tells you to. Taking over-the-counter medicines, vitamins, herbs, and supplements. Tests You may have tests, such as: Blood tests. Pee (urine) tests. Imaging tests. This may include a CT scan. Surgery safety Ask your health care provider: How your surgery site will be marked. What steps will be taken to help prevent infection. These steps may include: Washing skin with a soap that kills germs. Receiving antibiotics. General instructions If you will be going home right after the procedure, plan to have a responsible adult: Take you home from the hospital or clinic. You will not be allowed to drive. Care for you for the time you are told. What happens during the procedure?  An IV will be inserted into one of your veins. You may be given: A sedative. This helps you relax. Anesthesia. This will: Numb certain areas of your body. Make you fall asleep for surgery. A water-filled cushion may be placed behind your kidney or on your abdomen. In some cases, you may be placed in a tub of lukewarm water. Your body will be positioned in a way that makes it  easier to target the kidney stone. An X-ray or ultrasound exam will be done to locate your stone. Shock waves will be aimed at the stone. If you are awake, you may feel a tapping sensation as the shock waves pass through your body. A small mesh tube (stent) may be placed in  your ureter. This will help keep pee flowing from the kidney if the fragments of the stone have been blocking the ureter. The stent will be removed at a later time by your health care provider. The procedure may vary among health care providers and hospitals. What happens after the procedure? Your blood pressure, heart rate, breathing rate, and blood oxygen level will be monitored until you leave the hospital or clinic. You may have an X-ray after the procedure to see how many of the kidney stones were broken up. This will also show how much of the stone has passed. If there are still large fragments after treatment, you may need to have a second procedure at a later time. This information is not intended to replace advice given to you by your health care provider. Make sure you discuss any questions you have with your health care provider. Document Revised: 11/22/2022 Document Reviewed: 09/12/2021 Elsevier Patient Education  2024 [employer].

## 2024-01-18 NOTE — H&P (View-Only) (Signed)
 01/18/2024 2:04 PM   Dustin Jacobson 1945/09/02 [id number]  Referring provider: Bertell Satterfield, MD [address] [place],  [place] [address]  nephrolithiasis   HPI: [name] is a 78yo here for evaluation of nephrolithiasis. He developed left flank pain Saturday and presented to the ER yesterday and was diagnosed with a left 5mm UPJ calculus. This is his 5-6th stone event. He has had ESWL in the past  His pain is sharp, intermittent, mild to moderate and nonradiaiting. He has nausea and vomiting.    PMH: Past Medical History:  Diagnosis Date   Adenomatous colon polyp 2002   Early cataract    left   GERD (gastroesophageal reflux disease)    History of kidney stones    Primary localized osteoarthritis of left knee    Primary localized osteoarthritis of left knee    Primary localized osteoarthritis of right knee 04/14/2018   S/P colonoscopy 05/15/2006   Dr Trudie anal canal hemorrhoids, otherwise normal   S/P TKR (total knee replacement), left 04/14/2018   Schatzki's ring 05/15/2006   Last EGD Dr Rourk-> dilated 62F, small hiatal hernia    Surgical History: Past Surgical History:  Procedure Laterality Date   BUNIONECTOMY     COLONOSCOPY  04/28/2011   Dr. Shaaron: colonic diverticulosis, single diminutive polyp (tubular adenoma), 5 year surveillance   COLONOSCOPY N/A 02/18/2017   Procedure: COLONOSCOPY;  Surgeon: Shaaron Lamar HERO, MD;  [facility];  Service: Endoscopy;  Laterality: N/A;  1:45pm   COLONOSCOPY WITH PROPOFOL  N/A 07/30/2023   Procedure: COLONOSCOPY WITH PROPOFOL ;  Surgeon: Shaaron Lamar HERO, MD;  Location: [facility];  Service: Endoscopy;  Laterality: N/A;  8:00 am, asa 2   ESOPHAGOGASTRODUODENOSCOPY  2012   Dr. Shaaron: erosive reflux esophagitis, Schatzki's ring s/p dilation, H.pylori gastritis   ESOPHAGOGASTRODUODENOSCOPY N/A 02/18/2017   Procedure: ESOPHAGOGASTRODUODENOSCOPY (EGD);  Surgeon: Shaaron Lamar HERO, MD;  [facility];  Service: Endoscopy;  Laterality: N/A;   FINGER SURGERY Left    middle finger   INGUINAL HERNIA REPAIR     bilat   KNEE SURGERY     bilat   LITHOTRIPSY     x2   MALONEY DILATION N/A 02/18/2017   Procedure: MALONEY DILATION;  Surgeon: Shaaron Lamar HERO, MD;  [facility]: [facility];  Service: Endoscopy;  Laterality: N/A;   POLYPECTOMY  07/30/2023   Procedure: POLYPECTOMY;  Surgeon: Shaaron Lamar HERO, MD;  [facility]: [facility];  Service: Endoscopy;;   TOTAL KNEE ARTHROPLASTY Left 07/28/2016   Procedure: TOTAL KNEE ARTHROPLASTY;  Surgeon: Lamar Millman, MD;  Location: [facility] OR;  Service: Orthopedics;  Laterality: Left;   TOTAL KNEE ARTHROPLASTY Right 04/26/2018   Procedure: TOTAL KNEE ARTHROPLASTY;  Surgeon: Millman Lamar, MD;  Location: [facility] OR;  Service: Orthopedics;  Laterality: Right;   UMBILICAL HERNIA REPAIR      Home Medications:  Allergies as of 01/18/2024       Reactions   Relafen [nabumetone] Hives, Nausea Only, Other (See Comments)   Dizziness,fainting, itching        Medication List        Accurate as of January 18, 2024  2:04 PM. If you have any questions, ask your nurse or doctor.          folic acid  1 MG tablet Commonly known as: FOLVITE  Take 1 mg by mouth daily.   glimepiride 2 MG tablet Commonly known as: AMARYL Take 2 mg by mouth daily with breakfast.  HYDROcodone -acetaminophen  5-325 MG tablet Commonly known as: NORCO/VICODIN Take 1 tablet by mouth every 6 (six) hours as needed for severe pain (pain score 7-10).   meloxicam  15 MG tablet Commonly known as: MOBIC  Take 15 mg by mouth as needed.   ondansetron  4 MG disintegrating tablet Commonly known as: ZOFRAN -ODT Take 1 tablet (4 mg total) by mouth every 8 (eight) hours as needed for nausea or vomiting.   tamsulosin  0.4 MG Caps capsule Commonly known as: FLOMAX  Take 1 capsule (0.4 mg total) by mouth daily after supper.        Allergies:  Allergies  Allergen Reactions   Relafen  [Nabumetone] Hives, Nausea Only and Other (See Comments)    Dizziness,fainting, itching     Family History: Family History  Problem Relation Age of Onset   Heart failure Mother    Colon polyps Mother    Coronary artery disease Father    Lung cancer Brother    Anesthesia problems Neg Hx    Hypotension Neg Hx    Malignant hyperthermia Neg Hx    Pseudochol deficiency Neg Hx    Colon cancer Neg Hx     Social History:  reports that he has quit smoking. His smoking use included cigarettes. He has never used smokeless tobacco. He reports current alcohol use. He reports that he does not use drugs.  ROS: All other review of systems were reviewed and are negative except what is noted above in HPI  Physical Exam: BP 135/68   Pulse (!) 45   Constitutional:  Alert and oriented, No acute distress. HEENT: [place] AT, moist mucus membranes.  Trachea midline, no masses. Cardiovascular: No clubbing, cyanosis, or edema. Respiratory: Normal respiratory effort, no increased work of breathing. GI: Abdomen is soft, nontender, nondistended, no abdominal masses GU: No CVA tenderness.  Lymph: No cervical or inguinal lymphadenopathy. Skin: No rashes, bruises or suspicious lesions. Neurologic: Grossly intact, no focal deficits, moving all 4 extremities. Psychiatric: Normal mood and affect.  Laboratory Data: Lab Results  Component Value Date   WBC 12.2 (H) 01/17/2024   HGB 13.8 01/17/2024   HCT 40.8 01/17/2024   MCV 95.6 01/17/2024   PLT 204 01/17/2024    Lab Results  Component Value Date   CREATININE 1.77 (H) 01/17/2024    No results found for: PSA  No results found for: TESTOSTERONE  No results found for: HGBA1C  Urinalysis    Component Value Date/Time   COLORURINE YELLOW 01/17/2024 2141   APPEARANCEUR CLEAR 01/17/2024 2141   LABSPEC 1.013 01/17/2024 2141   PHURINE 5.0 01/17/2024 2141   GLUCOSEU NEGATIVE 01/17/2024 2141   HGBUR SMALL (A) 01/17/2024 2141   BILIRUBINUR  NEGATIVE 01/17/2024 2141   BILIRUBINUR negative 12/30/2019 1856   KETONESUR 5 (A) 01/17/2024 2141   PROTEINUR NEGATIVE 01/17/2024 2141   UROBILINOGEN 0.2 12/30/2019 1856   NITRITE NEGATIVE 01/17/2024 2141   LEUKOCYTESUR NEGATIVE 01/17/2024 2141    Lab Results  Component Value Date   BACTERIA NONE SEEN 01/17/2024    Pertinent Imaging: CT stone study yesterday and KUb today: Images reviewed and discussed with the procedure  No results found for this or any previous visit.  No results found for this or any previous visit.  No results found for this or any previous visit.  No results found for this or any previous visit.  No results found for this or any previous visit.  No results found for this or any previous visit.  No results found for this or any  previous visit.  Results for orders placed during the hospital encounter of 01/17/24  CT Renal Stone Study  Narrative CLINICAL DATA:  Abdominal and flank pain with stone suspected. Left flank pain started yesterday progressively worsening. Nausea. History of kidney stones.  EXAM: CT ABDOMEN AND PELVIS WITHOUT CONTRAST  TECHNIQUE: Multidetector CT imaging of the abdomen and pelvis was performed following the standard protocol without IV contrast.  RADIATION DOSE REDUCTION: This exam was performed according to the departmental dose-optimization program which includes automated exposure control, adjustment of the mA and/or kV according to patient size and/or use of iterative reconstruction technique.  COMPARISON:  10/24/2015  FINDINGS: Lower chest: Focal scarring or atelectasis in the lung bases, greater on the left. Small esophageal hiatal hernia.  Hepatobiliary: No focal liver abnormality is seen. No gallstones, gallbladder wall thickening, or biliary dilatation.  Pancreas: Unremarkable. No pancreatic ductal dilatation or surrounding inflammatory changes.  Spleen: Normal in size without focal  abnormality.  Adrenals/Urinary Tract: No adrenal gland nodules. 5 mm stone in the proximal left ureter at the ureteropelvic junction. There is proximal hydronephrosis with stranding around the left kidney. Additional 3 mm intrarenal stone on the left. Right kidney, right ureter, and the bladder are normal.  Stomach/Bowel: Stomach, small bowel, and colon are not abnormally distended. Small bowel are mostly decompressed. Scattered stool throughout the colon. No wall thickening or inflammatory stranding. Diverticulosis of the colon without evidence of acute diverticulitis. Appendix is normal.  Vascular/Lymphatic: Aortic atherosclerosis. No enlarged abdominal or pelvic lymph nodes.  Reproductive: Prostate is unremarkable.  Other: No free air or free fluid in the abdomen. Abdominal wall musculature appears intact.  Musculoskeletal: Degenerative changes in the spine. Degenerative disc disease at L4-5 and L5-S1. No acute bony abnormalities.  IMPRESSION: 1. 5 mm stone in the proximal left ureter with moderate proximal obstruction. 2. Additional 3 mm nonobstructing stone in the left kidney. 3. Aortic atherosclerosis. 4. Small esophageal hiatal hernia.   Electronically Signed By: Elsie Gravely M.D. On: 01/17/2024 21:21   Assessment & Plan:    1. Nephrolithiasis (Primary) -We discussed the management of kidney stones. These options include observation, ureteroscopy, shockwave lithotripsy (ESWL) and percutaneous nephrolithotomy (PCNL). We discussed which options are relevant to the patient's stone(s). We discussed the natural history of kidney stones as well as the complications of untreated stones and the impact on quality of life without treatment as well as with each of the above listed treatments. We also discussed the efficacy of each treatment in its ability to clear the stone burden. With any of these management options I discussed the signs and symptoms of infection and the  need for emergent treatment should these be experienced. For each option we discussed the ability of each procedure to clear the patient of their stone burden.   For observation I described the risks which include but are not limited to silent renal damage, life-threatening infection, need for emergent surgery, failure to pass stone and pain.   For ureteroscopy I described the risks which include bleeding, infection, damage to contiguous structures, positioning injury, ureteral stricture, ureteral avulsion, ureteral injury, need for prolonged ureteral stent, inability to perform ureteroscopy, need for an interval procedure, inability to clear stone burden, stent discomfort/pain, heart attack, stroke, pulmonary embolus and the inherent risks with general anesthesia.   For shockwave lithotripsy I described the risks which include arrhythmia, kidney contusion, kidney hemorrhage, need for transfusion, pain, inability to adequately break up stone, inability to pass stone fragments, Steinstrasse, infection associated with  obstructing stones, need for alternate surgical procedure, need for repeat shockwave lithotripsy, MI, CVA, PE and the inherent risks with anesthesia/conscious sedation.   For PCNL I described the risks including positioning injury, pneumothorax, hydrothorax, need for chest tube, inability to clear stone burden, renal laceration, arterial venous fistula or malformation, need for embolization of kidney, loss of kidney or renal function, need for repeat procedure, need for prolonged nephrostomy tube, ureteral avulsion, MI, CVA, PE and the inherent risks of general anesthesia.   - The patient would like to proceed with left ESWL   - Urinalysis, Routine w reflex microscopic   No follow-ups on file.  [name], MD  [facility] [place]

## 2024-01-18 NOTE — Addendum Note (Signed)
 Addended by: Nyjae Hodge L on: 01/18/2024 02:50 PM   Modules accepted: Orders

## 2024-01-19 ENCOUNTER — Encounter (HOSPITAL_COMMUNITY): Payer: Self-pay | Admitting: Urology

## 2024-01-19 ENCOUNTER — Ambulatory Visit (HOSPITAL_COMMUNITY)

## 2024-01-19 ENCOUNTER — Ambulatory Visit (HOSPITAL_COMMUNITY)
Admission: RE | Admit: 2024-01-19 | Discharge: 2024-01-19 | Disposition: A | Discharge status: Home / Self Care | Attending: Urology | Admitting: Urology

## 2024-01-19 ENCOUNTER — Encounter (HOSPITAL_COMMUNITY)
Admission: RE | Disposition: A | Discharge status: Home / Self Care | Payer: Self-pay | Source: Home / Self Care | Attending: Urology

## 2024-01-19 DIAGNOSIS — N202 Calculus of kidney with calculus of ureter: Secondary | ICD-10-CM | POA: Insufficient documentation

## 2024-01-19 DIAGNOSIS — Z87891 Personal history of nicotine dependence: Secondary | ICD-10-CM | POA: Insufficient documentation

## 2024-01-19 DIAGNOSIS — N200 Calculus of kidney: Secondary | ICD-10-CM | POA: Diagnosis not present

## 2024-01-19 DIAGNOSIS — I878 Other specified disorders of veins: Secondary | ICD-10-CM | POA: Diagnosis not present

## 2024-01-19 DIAGNOSIS — Z87442 Personal history of urinary calculi: Secondary | ICD-10-CM | POA: Diagnosis not present

## 2024-01-19 DIAGNOSIS — N201 Calculus of ureter: Secondary | ICD-10-CM | POA: Diagnosis not present

## 2024-01-19 HISTORY — PX: EXTRACORPOREAL SHOCK WAVE LITHOTRIPSY: SHX1557

## 2024-01-19 LAB — GLUCOSE, CAPILLARY: Glucose-Capillary: 98 mg/dL (ref 70–99)

## 2024-01-19 SURGERY — LITHOTRIPSY, ESWL
Anesthesia: LOCAL | Laterality: Left

## 2024-01-19 MED ORDER — OXYCODONE-ACETAMINOPHEN 5-325 MG PO TABS: 1.0000 | ORAL_TABLET | ORAL | 0 refills | Status: AC | PRN

## 2024-01-19 MED ORDER — DIAZEPAM 5 MG PO TABS
10.0000 mg | ORAL_TABLET | Freq: Once | ORAL | Status: AC
  Administered 2024-01-19: 10 mg via ORAL
  Filled 2024-01-19: qty 2

## 2024-01-19 MED ORDER — DIPHENHYDRAMINE HCL 25 MG PO CAPS
25.0000 mg | ORAL_CAPSULE | ORAL | Status: AC
  Administered 2024-01-19: 25 mg via ORAL
  Filled 2024-01-19: qty 1

## 2024-01-19 MED ORDER — SODIUM CHLORIDE 0.9 % IV SOLN: INTRAVENOUS | Status: DC

## 2024-01-19 MED ORDER — TAMSULOSIN HCL 0.4 MG PO CAPS
0.4000 mg | ORAL_CAPSULE | Freq: Every day | ORAL | 0 refills | Status: AC
Start: 2024-01-19 — End: ?

## 2024-01-19 MED ORDER — ONDANSETRON 4 MG PO TBDP: 4.0000 mg | ORAL_TABLET | Freq: Three times a day (TID) | ORAL | 0 refills | Status: AC | PRN

## 2024-01-19 NOTE — Interval H&P Note (Signed)
 History and Physical Interval Note:  01/19/2024 9:22 AM  Dustin Jacobson  has presented today for surgery, with the diagnosis of left nephrolitiasis.  The various methods of treatment have been discussed with the patient and family. After consideration of risks, benefits and other options for treatment, the patient has consented to  Procedure(s): LITHOTRIPSY, ESWL (Left) as a surgical intervention.  The patient's history has been reviewed, patient examined, no change in status, stable for surgery.  I have reviewed the patient's chart and labs.  Questions were answered to the patient's satisfaction.     [name]

## 2024-01-20 ENCOUNTER — Telehealth: Payer: Self-pay | Admitting: Urology

## 2024-01-20 ENCOUNTER — Encounter (HOSPITAL_COMMUNITY): Payer: Self-pay | Admitting: Urology

## 2024-01-20 ENCOUNTER — Other Ambulatory Visit: Payer: Self-pay

## 2024-01-20 DIAGNOSIS — N200 Calculus of kidney: Secondary | ICD-10-CM

## 2024-01-20 NOTE — Telephone Encounter (Signed)
 Wife called and needs 2 week follow up from litho

## 2024-01-20 NOTE — Telephone Encounter (Signed)
 Appt updated with patient's wife.

## 2024-01-20 NOTE — Telephone Encounter (Signed)
 They can keep the post op appointment, it conflicts with their schedule. Please call to reschedule post op appt.

## 2024-01-20 NOTE — Telephone Encounter (Signed)
 See other encounter- appt updated with patient's wife.

## 2024-01-21 ENCOUNTER — Encounter (HOSPITAL_COMMUNITY): Payer: Self-pay | Admitting: Urology

## 2024-01-22 ENCOUNTER — Emergency Department (HOSPITAL_COMMUNITY)
Admission: EM | Admit: 2024-01-22 | Discharge: 2024-01-22 | Disposition: A | Discharge status: Home / Self Care | Attending: Emergency Medicine | Admitting: Emergency Medicine

## 2024-01-22 ENCOUNTER — Emergency Department (HOSPITAL_COMMUNITY)

## 2024-01-22 ENCOUNTER — Encounter (HOSPITAL_COMMUNITY): Payer: Self-pay | Admitting: *Deleted

## 2024-01-22 ENCOUNTER — Other Ambulatory Visit: Payer: Self-pay

## 2024-01-22 DIAGNOSIS — N200 Calculus of kidney: Secondary | ICD-10-CM | POA: Diagnosis not present

## 2024-01-22 DIAGNOSIS — N12 Tubulo-interstitial nephritis, not specified as acute or chronic: Secondary | ICD-10-CM | POA: Diagnosis not present

## 2024-01-22 DIAGNOSIS — K449 Diaphragmatic hernia without obstruction or gangrene: Secondary | ICD-10-CM | POA: Diagnosis not present

## 2024-01-22 DIAGNOSIS — R509 Fever, unspecified: Secondary | ICD-10-CM | POA: Diagnosis not present

## 2024-01-22 DIAGNOSIS — R109 Unspecified abdominal pain: Secondary | ICD-10-CM | POA: Diagnosis present

## 2024-01-22 DIAGNOSIS — N23 Unspecified renal colic: Secondary | ICD-10-CM | POA: Insufficient documentation

## 2024-01-22 DIAGNOSIS — K5730 Diverticulosis of large intestine without perforation or abscess without bleeding: Secondary | ICD-10-CM | POA: Diagnosis not present

## 2024-01-22 LAB — CBC WITH DIFFERENTIAL/PLATELET
Abs Immature Granulocytes: 0.03 K/uL (ref 0.00–0.07)
Basophils Absolute: 0.1 K/uL (ref 0.0–0.1)
Basophils Relative: 1 %
Eosinophils Absolute: 0.1 K/uL (ref 0.0–0.5)
Eosinophils Relative: 1 %
HCT: 38.6 % — ABNORMAL LOW (ref 39.0–52.0)
Hemoglobin: 13.2 g/dL (ref 13.0–17.0)
Immature Granulocytes: 0 %
Lymphocytes Relative: 14 %
Lymphs Abs: 1.3 K/uL (ref 0.7–4.0)
MCH: 32.8 pg (ref 26.0–34.0)
MCHC: 34.2 g/dL (ref 30.0–36.0)
MCV: 96 fL (ref 80.0–100.0)
Monocytes Absolute: 0.9 K/uL (ref 0.1–1.0)
Monocytes Relative: 10 %
Neutro Abs: 6.9 K/uL (ref 1.7–7.7)
Neutrophils Relative %: 74 %
Platelets: 238 K/uL (ref 150–400)
RBC: 4.02 MIL/uL — ABNORMAL LOW (ref 4.22–5.81)
RDW: 13 % (ref 11.5–15.5)
WBC: 9.2 K/uL (ref 4.0–10.5)
nRBC: 0 % (ref 0.0–0.2)

## 2024-01-22 LAB — BASIC METABOLIC PANEL WITH GFR
Anion gap: 13 (ref 5–15)
BUN: 25 mg/dL — ABNORMAL HIGH (ref 8–23)
CO2: 24 mmol/L (ref 22–32)
Calcium: 8.6 mg/dL — ABNORMAL LOW (ref 8.9–10.3)
Chloride: 99 mmol/L (ref 98–111)
Creatinine, Ser: 1.49 mg/dL — ABNORMAL HIGH (ref 0.61–1.24)
GFR, Estimated: 48 mL/min — ABNORMAL LOW (ref >60)
Glucose, Bld: 150 mg/dL — ABNORMAL HIGH (ref 70–99)
Potassium: 4.7 mmol/L (ref 3.5–5.1)
Sodium: 136 mmol/L (ref 135–145)

## 2024-01-22 LAB — URINALYSIS, ROUTINE W REFLEX MICROSCOPIC
Bilirubin Urine: NEGATIVE
Glucose, UA: NEGATIVE mg/dL
Ketones, ur: NEGATIVE mg/dL
Nitrite: NEGATIVE
Protein, ur: 30 mg/dL — AB
RBC / HPF: >50 RBC/hpf (ref 0–5)
Specific Gravity, Urine: 1.02 (ref 1.005–1.030)
pH: 5 (ref 5.0–8.0)

## 2024-01-22 LAB — LACTIC ACID, PLASMA: Lactic Acid, Venous: 0.9 mmol/L (ref 0.5–1.9)

## 2024-01-22 MED ORDER — SODIUM CHLORIDE 0.9 % IV SOLN
1.0000 g | Freq: Once | INTRAVENOUS | Status: AC
  Administered 2024-01-22: 1 g via INTRAVENOUS
  Filled 2024-01-22: qty 10

## 2024-01-22 MED ORDER — HYDROMORPHONE HCL 1 MG/ML IJ SOLN
1.0000 mg | Freq: Once | INTRAMUSCULAR | Status: AC
  Administered 2024-01-22: 1 mg via INTRAVENOUS
  Filled 2024-01-22: qty 1

## 2024-01-22 MED ORDER — KETOROLAC TROMETHAMINE 15 MG/ML IJ SOLN
15.0000 mg | Freq: Once | INTRAMUSCULAR | Status: AC
  Administered 2024-01-22: 15 mg via INTRAVENOUS
  Filled 2024-01-22: qty 1

## 2024-01-22 MED ORDER — SULFAMETHOXAZOLE-TRIMETHOPRIM 800-160 MG PO TABS
1.0000 | ORAL_TABLET | Freq: Once | ORAL | Status: AC
  Administered 2024-01-22: 1 via ORAL
  Filled 2024-01-22: qty 1

## 2024-01-22 MED ORDER — SULFAMETHOXAZOLE-TRIMETHOPRIM 800-160 MG PO TABS: 1.0000 | ORAL_TABLET | Freq: Two times a day (BID) | ORAL | 0 refills | Status: AC

## 2024-01-22 MED ORDER — ONDANSETRON HCL 4 MG/2ML IJ SOLN
4.0000 mg | Freq: Once | INTRAMUSCULAR | Status: AC
  Administered 2024-01-22: 4 mg via INTRAVENOUS
  Filled 2024-01-22: qty 2

## 2024-01-22 MED ORDER — IOHEXOL 300 MG/ML  SOLN
80.0000 mL | Freq: Once | INTRAMUSCULAR | Status: AC | PRN
  Administered 2024-01-22: 80 mL via INTRAVENOUS

## 2024-01-22 NOTE — Discharge Instructions (Signed)
 CT scan reveals that the stones that were broken with lithotripsy are transiting, and are currently stuck by the bladder.  Your pain is likely because of the transiting stones.  Because we have concerns for infection, we have started you on antibiotics.  Please read the instructions provided. Continue to hydrate well.  Please return to the ER if your symptoms worsen; you have increased pain, fevers, chills, inability to keep any medications down, confusion. Otherwise see the outpatient doctor as requested.

## 2024-01-22 NOTE — ED Provider Notes (Signed)
 [place] EMERGENCY DEPARTMENT AT [facility] Provider Note   CSN: [id number] Arrival date & time: 01/22/24  1354     Patient presents with: Flank Pain   Dustin Jacobson is a 78 y.o. male.   HPI    78 year old patient comes in with chief complaint of abdominal pain. Patient has previous history of multiple kidney stones, and he has required 3 prior lithotripsy.  He recently had another lithotripsy done on Tuesday, 3 days back.  Patient states that with this lithotripsy, he has had persistent left flank pain, which is unusual, and pain has intensified.  He has also seen blood in the stool and is having urinary frequency.  Review of system is positive for nausea.  Per wife, patient started feeling warm yesterday and was having chills, which prompted them to call urology, and they advised that patient come to the ER.  Prior to Admission medications   Medication Sig Start Date End Date Taking? Authorizing Provider  folic acid  (FOLVITE ) 1 MG tablet Take 1 mg by mouth daily.    [provider]  glimepiride (AMARYL) 2 MG tablet Take 2 mg by mouth daily with breakfast.    [provider]  HYDROcodone -acetaminophen  (NORCO/VICODIN) 5-325 MG tablet Take 1 tablet by mouth every 6 (six) hours as needed for severe pain (pain score 7-10). 01/17/24   Barrett, Warren SAILOR, PA-C  meloxicam  (MOBIC ) 15 MG tablet Take 15 mg by mouth as needed.    [provider]  ondansetron  (ZOFRAN -ODT) 4 MG disintegrating tablet Take 1 tablet (4 mg total) by mouth every 8 (eight) hours as needed for nausea or vomiting. 01/19/24   McKenzie, Belvie CROME, MD  oxyCODONE -acetaminophen  (PERCOCET) 5-325 MG tablet Take 1 tablet by mouth every 4 (four) hours as needed. 01/19/24   McKenzie, Belvie CROME, MD  tamsulosin  (FLOMAX ) 0.4 MG CAPS capsule Take 1 capsule (0.4 mg total) by mouth daily after supper. 01/19/24   Sherrilee Belvie CROME, MD  gabapentin  (NEURONTIN ) 300 MG capsule 1 tablet before bed for  nerve pain 04/27/18 12/30/19  Shepperson, Kirstin, PA-C    Allergies: Relafen [nabumetone]    Review of Systems  All other systems reviewed and are negative.   Updated Vital Signs BP (!) 140/82   Pulse 70   Temp 98.9 F (37.2 C)   Resp 17   SpO2 96%   Physical Exam Vitals and nursing note reviewed.  Constitutional:      Appearance: He is well-developed. He is ill-appearing.  HENT:     Head: Atraumatic.  Cardiovascular:     Rate and Rhythm: Normal rate.  Pulmonary:     Effort: Pulmonary effort is normal.  Abdominal:     Tenderness: There is abdominal tenderness. There is no guarding or rebound.  Musculoskeletal:     Cervical back: Neck supple.  Skin:    General: Skin is warm.  Neurological:     Mental Status: He is alert and oriented to person, place, and time.     (all labs ordered are listed, but only abnormal results are displayed) Labs Reviewed  URINALYSIS, ROUTINE W REFLEX MICROSCOPIC - Abnormal; Notable for the following components:      Result Value   Color, Urine AMBER (*)    APPearance CLOUDY (*)    Hgb urine dipstick LARGE (*)    Protein, ur 30 (*)    Leukocytes,Ua TRACE (*)    Bacteria, UA RARE (*)    All other components within normal limits  CBC  WITH DIFFERENTIAL/PLATELET - Abnormal; Notable for the following components:   RBC 4.02 (*)    HCT 38.6 (*)    All other components within normal limits  BASIC METABOLIC PANEL WITH GFR - Abnormal; Notable for the following components:   Glucose, Bld 150 (*)    BUN 25 (*)    Creatinine, Ser 1.49 (*)    Calcium 8.6 (*)    GFR, Estimated 48 (*)    All other components within normal limits  URINE CULTURE  LACTIC ACID, PLASMA    EKG: None  Radiology: CT ABDOMEN PELVIS W CONTRAST Result Date: 01/22/2024 CLINICAL DATA:  Abdominal/flank pain, stone suspected flank pain, fevers, post lithotripsy Fever today, lithotripsy on Tuesday. EXAM: CT ABDOMEN AND PELVIS WITH CONTRAST TECHNIQUE: Multidetector CT  imaging of the abdomen and pelvis was performed using the standard protocol following bolus administration of intravenous contrast. RADIATION DOSE REDUCTION: This exam was performed according to the departmental dose-optimization program which includes automated exposure control, adjustment of the mA and/or kV according to patient size and/or use of iterative reconstruction technique. CONTRAST:  80mL OMNIPAQUE  IOHEXOL  300 MG/ML  SOLN COMPARISON:  CT 01/17/2024 FINDINGS: Lower chest: Dependent atelectasis, left greater than right lower lobe. No pleural effusion. Hepatobiliary: No focal liver lesion. Layering hyperdensity in the gallbladder may represent sludge or stones. No pericholecystic inflammation. No biliary dilatation. Pancreas: No ductal dilatation or inflammation. Spleen: Normal in size without focal abnormality. Adrenals/Urinary Tract: Normal adrenal glands. Persistent left hydroureteronephrosis with multiple layering stones in the distal left ureter spanning 15 mm, for example series 4, image 69.The previous proximal left proximal ureteral stone is no longer seen. Faint hyperdensity in the left proximal ureter may represent blood products or tiny stone fragment. Small nonobstructing stone in the lower pole of the left kidney. Mild left perinephric edema and fat stranding. No perinephric collection. No right renal calculi or hydronephrosis. Absent left renal excretion on delayed phase imaging. Indeterminate cortical hypodensity in the posterior left kidney measuring 16 mm, series 3, image 29. No bladder stones or wall thickening. Stomach/Bowel: Small hiatal hernia. No small bowel obstruction or inflammation. Normal appendix visualized. Moderate colonic stool burden, particularly in the right colon. Left colonic diverticula without diverticulitis. Vascular/Lymphatic: Aortic and branch atherosclerosis. No aortic aneurysm. The portal vein is patent. No enlarged lymph nodes in the abdomen or pelvis.  Reproductive: Prostate is unremarkable. Other: Left retroperitoneal edema and perinephric stranding. No discrete fluid collection. No ascites. Musculoskeletal: Multilevel degenerative change in the spine. No acute osseous findings. IMPRESSION: 1. Persistent left hydroureteronephrosis with multiple layering stones in the distal left ureter spanning 15 mm. 2. The previous proximal left ureteral stone is no longer seen post lithotripsy. Faint hyperdensity in the left proximal ureter may represent blood products or tiny stone fragments. 3. Indeterminate cortical hypodensity in the posterior left kidney measuring 16 mm. Recommend nonemergent renal protocol MRI for characterization after resolution of acute event. 4. Small nonobstructing stone in the lower pole of the left kidney. 5. Layering hyperdensity in the gallbladder may represent sludge or stones. No pericholecystic inflammation. 6. Small hiatal hernia. Colonic diverticulosis without diverticulitis. Aortic Atherosclerosis (ICD10-I70.0). Electronically Signed   By: Andrea Gasman M.D.   On: 01/22/2024 19:42     Procedures   Medications Ordered in the ED  sulfamethoxazole -trimethoprim  (BACTRIM  DS) 800-160 MG per tablet 1 tablet (has no administration in time range)  ketorolac  (TORADOL ) 15 MG/ML injection 15 mg (has no administration in time range)  HYDROmorphone  (DILAUDID ) injection 1 mg (1  mg Intravenous Given 01/22/24 1851)  ondansetron  (ZOFRAN ) injection 4 mg (4 mg Intravenous Given 01/22/24 1849)  cefTRIAXone  (ROCEPHIN ) 1 g in sodium chloride  0.9 % 100 mL IVPB (0 g Intravenous Stopped 01/22/24 1910)  iohexol  (OMNIPAQUE ) 300 MG/ML solution 80 mL (80 mLs Intravenous Contrast Given 01/22/24 1904)                                    Medical Decision Making Amount and/or Complexity of Data Reviewed Labs: ordered. Radiology: ordered.  Risk Prescription drug management.   78 year old male comes in with chief complaint of abdominal pain and  fevers, chills.  Patient has history of recent lithotripsy and has had multiple stones in the past.  On exam, patient appears uncomfortable but not toxic.  He has abdominal discomfort on the left side.  Differential diagnosis for this patient includes ureteral colic, hydronephrosis, perinephric hematoma, acute cystitis, pyelonephritis.  Basic labs, CT abdomen pelvis and urine analysis with culture will be obtained.  Reassessment: Creatinine is at baseline at 1.49.  No leukocytosis.  Lactate is normal.  CT pending at this time.  Patient does have pyuria, hematuria.  This could be all sequelae of the lithotripsy, but patient is reporting some chills, fevers, worsening pain therefore we will give him a dose of antibiotics.  Temp noted to be 99.4 in the ER orally.   9:04 PM CT scan shows that the stones are in transit and struck by UVJ.  There is hydronephrosis..  There appears to be significant stone burden.  I did consult and speak with Dr. Nieves, urology. He is comfortable with patient receiving Bactrim , and advised return precautions.  The patient appears reasonably screened and/or stabilized for discharge and I doubt any other medical condition or other [facility] requiring further screening, evaluation, or treatment in the ED at this time prior to discharge.   Results from the ER workup discussed with the patient face to face and all questions answered to the best of my ability. The patient is safe for discharge with strict return precautions.   Final diagnoses:  Ureteral colic  Pyelonephritis    ED Discharge Orders     None          Charlyn Sora, MD 01/22/24 2105

## 2024-01-22 NOTE — ED Notes (Signed)
 ED Provider at bedside.

## 2024-01-22 NOTE — ED Triage Notes (Signed)
 Pt had lithotripsy on Tuesday for kidney stone, wife states pt with fever (99.8) today. Pain returned last night. +chills

## 2024-01-24 LAB — URINE CULTURE: Culture: NO GROWTH

## 2024-01-26 ENCOUNTER — Telehealth: Payer: Self-pay | Admitting: Urology

## 2024-01-26 NOTE — Telephone Encounter (Signed)
 Return call to patient and he state's that he still in a lot of pain. Patient state's he would like this out ASAP. Patient is aware a message will be sent to provider on advisement.

## 2024-01-26 NOTE — Telephone Encounter (Signed)
 Patient called he had to go to the ER Friday because the stone moved. He wants Dr Sherrilee to go in and remove the stone.  Please call

## 2024-01-26 NOTE — Telephone Encounter (Signed)
 Patient state's he is having extreme pain and would like to have this surgery to remove remaining stone. Pt state's this has been going on for about 10 days now. Pt state's he had surgery in August and stone was not completely removed. Pt is aware a message will be sent to the provider.

## 2024-01-27 ENCOUNTER — Ambulatory Visit (HOSPITAL_COMMUNITY)
Admission: RE | Admit: 2024-01-27 | Discharge: 2024-01-27 | Disposition: A | Discharge status: Home / Self Care | Source: Ambulatory Visit | Attending: Urology | Admitting: Urology

## 2024-01-27 ENCOUNTER — Telehealth: Payer: Self-pay | Admitting: Urology

## 2024-01-27 DIAGNOSIS — Z87442 Personal history of urinary calculi: Secondary | ICD-10-CM | POA: Diagnosis not present

## 2024-01-27 DIAGNOSIS — N200 Calculus of kidney: Secondary | ICD-10-CM | POA: Diagnosis not present

## 2024-01-27 DIAGNOSIS — N202 Calculus of kidney with calculus of ureter: Secondary | ICD-10-CM | POA: Diagnosis not present

## 2024-01-27 NOTE — Telephone Encounter (Signed)
 I spoke with patient and informed him that Dr. Sherrilee would like for him to have another xray and we would call once he reviews imaging to set up procedure.  Patient wanted to try to call [facility] to see if he can be seen sooner that 09/09.  Patient will call back to confirm after trying [facility].

## 2024-01-27 NOTE — Telephone Encounter (Signed)
 Please review KUB and advise if we can move forward with litho on 09/09

## 2024-01-27 NOTE — Telephone Encounter (Signed)
 Patient called and he is going to do the xray today and would like you to call him with the time for lithotripsy on 02/02/24

## 2024-01-29 ENCOUNTER — Other Ambulatory Visit: Payer: Self-pay

## 2024-01-29 ENCOUNTER — Other Ambulatory Visit: Payer: Self-pay | Admitting: Urology

## 2024-01-29 DIAGNOSIS — N200 Calculus of kidney: Secondary | ICD-10-CM

## 2024-02-01 ENCOUNTER — Encounter (HOSPITAL_COMMUNITY)
Admission: RE | Admit: 2024-02-01 | Discharge: 2024-02-01 | Disposition: A | Discharge status: Home / Self Care | Source: Ambulatory Visit | Attending: Urology

## 2024-02-02 ENCOUNTER — Encounter (HOSPITAL_COMMUNITY): Admission: RE | Payer: Self-pay | Source: Home / Self Care

## 2024-02-02 ENCOUNTER — Ambulatory Visit (HOSPITAL_COMMUNITY): Admission: RE | Admit: 2024-02-02 | Source: Home / Self Care | Admitting: Urology

## 2024-02-02 DIAGNOSIS — N201 Calculus of ureter: Secondary | ICD-10-CM

## 2024-02-02 SURGERY — LITHOTRIPSY, ESWL
Anesthesia: LOCAL | Laterality: Left

## 2024-02-10 ENCOUNTER — Encounter: Admitting: Urology

## 2024-02-17 ENCOUNTER — Encounter: Admitting: Urology

## 2024-02-24 ENCOUNTER — Ambulatory Visit (HOSPITAL_COMMUNITY)
Admission: RE | Admit: 2024-02-24 | Discharge: 2024-02-24 | Disposition: A | Discharge status: Home / Self Care | Source: Ambulatory Visit | Attending: Urology | Admitting: Urology

## 2024-02-24 ENCOUNTER — Ambulatory Visit: Admitting: Urology

## 2024-02-24 VITALS — BP 129/80 | HR 62

## 2024-02-24 DIAGNOSIS — Z87442 Personal history of urinary calculi: Secondary | ICD-10-CM | POA: Diagnosis not present

## 2024-02-24 DIAGNOSIS — N200 Calculus of kidney: Secondary | ICD-10-CM

## 2024-02-24 DIAGNOSIS — Z09 Encounter for follow-up examination after completed treatment for conditions other than malignant neoplasm: Secondary | ICD-10-CM

## 2024-02-24 NOTE — Progress Notes (Signed)
 02/24/2024 3:22 PM   Dustin Jacobson [birth year] [id number]  Referring provider: Bertell Satterfield, MD [address] [place],  [place] [address]  Followup nephrolithiasis   HPI: [name] is a 78yo here for followup for nephrolithiasis. He passed his ureteral calculus and brought it with him today. He denies any flank pain. He denies nay worsening LUTS.    PMH: Past Medical History:  Diagnosis Date   Adenomatous colon polyp 2002   Early cataract    left   GERD (gastroesophageal reflux disease)    History of kidney stones    Primary localized osteoarthritis of left knee    Primary localized osteoarthritis of left knee    Primary localized osteoarthritis of right knee 04/14/2018   S/P colonoscopy 05/15/2006   Dr Trudie anal canal hemorrhoids, otherwise normal   S/P TKR (total knee replacement), left 04/14/2018   Schatzki's ring 05/15/2006   Last EGD Dr Rourk-> dilated 51F, small hiatal hernia    Surgical History: Past Surgical History:  Procedure Laterality Date   BUNIONECTOMY     COLONOSCOPY  04/28/2011   Dr. Shaaron: colonic diverticulosis, single diminutive polyp (tubular adenoma), 5 year surveillance   COLONOSCOPY N/A 02/18/2017   Procedure: COLONOSCOPY;  Surgeon: Shaaron Lamar HERO, MD;  [facility]: [facility];  Service: Endoscopy;  Laterality: N/A;  1:45pm   COLONOSCOPY WITH PROPOFOL  N/A 07/30/2023   Procedure: COLONOSCOPY WITH PROPOFOL ;  Surgeon: Shaaron Lamar HERO, MD;  Location: [facility];  Service: Endoscopy;  Laterality: N/A;  8:00 am, asa 2   ESOPHAGOGASTRODUODENOSCOPY  2012   Dr. Shaaron: erosive reflux esophagitis, Schatzki's ring s/p dilation, H.pylori gastritis   ESOPHAGOGASTRODUODENOSCOPY N/A 02/18/2017   Procedure: ESOPHAGOGASTRODUODENOSCOPY (EGD);  Surgeon: Shaaron Lamar HERO, MD;  [facility]: [facility];  Service: Endoscopy;  Laterality: N/A;   EXTRACORPOREAL SHOCK WAVE LITHOTRIPSY Left 01/19/2024   Procedure: LITHOTRIPSY, ESWL;  Surgeon:  Sherrilee Belvie CROME, MD;  Location: [facility];  Service: Urology;  Laterality: Left;   FINGER SURGERY Left    middle finger   INGUINAL HERNIA REPAIR     bilat   KNEE SURGERY     bilat   LITHOTRIPSY     x2   MALONEY DILATION N/A 02/18/2017   Procedure: AGAPITO DILATION;  Surgeon: Shaaron Lamar HERO, MD;  [facility]: [facility];  Service: Endoscopy;  Laterality: N/A;   POLYPECTOMY  07/30/2023   Procedure: POLYPECTOMY;  Surgeon: Shaaron Lamar HERO, MD;  [facility]: [facility];  Service: Endoscopy;;   TOTAL KNEE ARTHROPLASTY Left 07/28/2016   Procedure: TOTAL KNEE ARTHROPLASTY;  Surgeon: Lamar Millman, MD;  Location: [facility];  Service: Orthopedics;  Laterality: Left;   TOTAL KNEE ARTHROPLASTY Right 04/26/2018   Procedure: TOTAL KNEE ARTHROPLASTY;  Surgeon: Millman Lamar, MD;  Location: [facility] OR;  Service: Orthopedics;  Laterality: Right;   UMBILICAL HERNIA REPAIR      Home Medications:  Allergies as of 02/24/2024       Reactions   Relafen [nabumetone] Hives, Nausea Only, Other (See Comments)   Dizziness,fainting, itching        Medication List        Accurate as of February 24, 2024  3:22 PM. If you have any questions, ask your nurse or doctor.          folic acid  1 MG tablet Commonly known as: FOLVITE  Take 1 mg by mouth daily.   glimepiride 2 MG tablet Commonly known as: AMARYL Take 2 mg by mouth daily with breakfast.  HYDROcodone -acetaminophen  5-325 MG tablet Commonly known as: NORCO/VICODIN Take 1 tablet by mouth every 6 (six) hours as needed for severe pain (pain score 7-10).   meloxicam  15 MG tablet Commonly known as: MOBIC  Take 15 mg by mouth as needed.   ondansetron  4 MG disintegrating tablet Commonly known as: ZOFRAN -ODT Take 1 tablet (4 mg total) by mouth every 8 (eight) hours as needed for nausea or vomiting.   oxyCODONE -acetaminophen  5-325 MG tablet Commonly known as: Percocet Take 1 tablet by mouth every 4 (four) hours as needed.   tamsulosin  0.4 MG Caps  capsule Commonly known as: FLOMAX  Take 1 capsule (0.4 mg total) by mouth daily after supper.        Allergies:  Allergies  Allergen Reactions   Relafen [Nabumetone] Hives, Nausea Only and Other (See Comments)    Dizziness,fainting, itching     Family History: Family History  Problem Relation Age of Onset   Heart failure Mother    Colon polyps Mother    Coronary artery disease Father    Lung cancer Brother    Anesthesia problems Neg Hx    Hypotension Neg Hx    Malignant hyperthermia Neg Hx    Pseudochol deficiency Neg Hx    Colon cancer Neg Hx     Social History:  reports that he has quit smoking. His smoking use included cigarettes. He has never used smokeless tobacco. He reports current alcohol use. He reports that he does not use drugs.  ROS: All other review of systems were reviewed and are negative except what is noted above in HPI  Physical Exam: BP 129/80   Pulse 62   Constitutional:  Alert and oriented, No acute distress. HEENT: [name], moist mucus membranes.  Trachea midline, no masses. Cardiovascular: No clubbing, cyanosis, or edema. Respiratory: Normal respiratory effort, no increased work of breathing. GI: Abdomen is soft, nontender, nondistended, no abdominal masses GU: No CVA tenderness.  Lymph: No cervical or inguinal lymphadenopathy. Skin: No rashes, bruises or suspicious lesions. Neurologic: Grossly intact, no focal deficits, moving all 4 extremities. Psychiatric: Normal mood and affect.  Laboratory Data: Lab Results  Component Value Date   WBC 9.2 01/22/2024   HGB 13.2 01/22/2024   HCT 38.6 (L) 01/22/2024   MCV 96.0 01/22/2024   PLT 238 01/22/2024    Lab Results  Component Value Date   CREATININE 1.49 (H) 01/22/2024    No results found for: PSA  No results found for: TESTOSTERONE  No results found for: HGBA1C  Urinalysis    Component Value Date/Time   COLORURINE AMBER (A) 01/22/2024 1424   APPEARANCEUR CLOUDY (A)  01/22/2024 1424   APPEARANCEUR Clear 01/18/2024 1334   LABSPEC 1.020 01/22/2024 1424   PHURINE 5.0 01/22/2024 1424   GLUCOSEU NEGATIVE 01/22/2024 1424   HGBUR LARGE (A) 01/22/2024 1424   BILIRUBINUR NEGATIVE 01/22/2024 1424   BILIRUBINUR Negative 01/18/2024 1334   KETONESUR NEGATIVE 01/22/2024 1424   PROTEINUR 30 (A) 01/22/2024 1424   UROBILINOGEN 0.2 12/30/2019 1856   NITRITE NEGATIVE 01/22/2024 1424   LEUKOCYTESUR TRACE (A) 01/22/2024 1424    Lab Results  Component Value Date   LABMICR See below: 01/18/2024   WBCUA 0-5 01/18/2024   LABEPIT 0-10 01/18/2024   BACTERIA RARE (A) 01/22/2024    Pertinent Imaging:  Results for orders placed during the hospital encounter of 01/27/24  DG Abd 1 View  Narrative EXAM: 1 VIEW XRAY OF THE ABDOMEN 01/27/2024 10:05:00 AM  COMPARISON: 01/19/2024  CLINICAL HISTORY: Kidney stone. Pt  has a left sided kidney stone for 11 days now. Hx of hernia repair, kidney stones, lithotripsy.  FINDINGS:  BOWEL: Nonobstructive bowel gas pattern.  SOFT TISSUES: 7 mm calculus in the left renal shadow. Multiple new calculi in the left hemipelvis with the largest measuring 1.1 cm. Multiple stacked stones are identified in the expected location of the distal left ureter measuring up to 3 mm.  BONES: No acute osseous abnormality. Atherosclerotic plaque noted.  IMPRESSION: 1. 7 mm calculus in the left renal shadow. 2. Multiple stacked stones in the expected location of the distal left ureter measuring up to 3 mm.  Electronically signed by: Waddell Calk MD 02/04/2024 03:19 PM EDT RP Workstation: [id number]  No results found for this or any previous visit.  No results found for this or any previous visit.  No results found for this or any previous visit.  No results found for this or any previous visit.  No results found for this or any previous visit.  No results found for this or any previous visit.  Results for orders placed  during the hospital encounter of 01/17/24  CT Renal Stone Study  Narrative CLINICAL DATA:  Abdominal and flank pain with stone suspected. Left flank pain started yesterday progressively worsening. Nausea. History of kidney stones.  EXAM: CT ABDOMEN AND PELVIS WITHOUT CONTRAST  TECHNIQUE: Multidetector CT imaging of the abdomen and pelvis was performed following the standard protocol without IV contrast.  RADIATION DOSE REDUCTION: This exam was performed according to the departmental dose-optimization program which includes automated exposure control, adjustment of the mA and/or kV according to patient size and/or use of iterative reconstruction technique.  COMPARISON:  10/24/2015  FINDINGS: Lower chest: Focal scarring or atelectasis in the lung bases, greater on the left. Small esophageal hiatal hernia.  Hepatobiliary: No focal liver abnormality is seen. No gallstones, gallbladder wall thickening, or biliary dilatation.  Pancreas: Unremarkable. No pancreatic ductal dilatation or surrounding inflammatory changes.  Spleen: Normal in size without focal abnormality.  Adrenals/Urinary Tract: No adrenal gland nodules. 5 mm stone in the proximal left ureter at the ureteropelvic junction. There is proximal hydronephrosis with stranding around the left kidney. Additional 3 mm intrarenal stone on the left. Right kidney, right ureter, and the bladder are normal.  Stomach/Bowel: Stomach, small bowel, and colon are not abnormally distended. Small bowel are mostly decompressed. Scattered stool throughout the colon. No wall thickening or inflammatory stranding. Diverticulosis of the colon without evidence of acute diverticulitis. Appendix is normal.  Vascular/Lymphatic: Aortic atherosclerosis. No enlarged abdominal or pelvic lymph nodes.  Reproductive: Prostate is unremarkable.  Other: No free air or free fluid in the abdomen. Abdominal wall musculature appears  intact.  Musculoskeletal: Degenerative changes in the spine. Degenerative disc disease at L4-5 and L5-S1. No acute bony abnormalities.  IMPRESSION: 1. 5 mm stone in the proximal left ureter with moderate proximal obstruction. 2. Additional 3 mm nonobstructing stone in the left kidney. 3. Aortic atherosclerosis. 4. Small esophageal hiatal hernia.   Electronically Signed By: Elsie Gravely M.D. On: 01/17/2024 21:21   Assessment & Plan:    1. Kidney stones (Primary) Followup 4 months with a renal US  - Urinalysis, Routine w reflex microscopic - Stone Analysis - Ultrasound renal complete; Future   No follow-ups on file.  [name], MD  [facility] [place]

## 2024-03-01 ENCOUNTER — Encounter: Payer: Self-pay | Admitting: Urology

## 2024-03-01 NOTE — Patient Instructions (Signed)

## 2024-03-05 LAB — STONE ANALYSIS
Calcium Oxalate Monohydrate: 100 %
Weight Calculi: 168 mg

## 2024-06-27 ENCOUNTER — Other Ambulatory Visit (HOSPITAL_COMMUNITY)

## 2024-06-29 ENCOUNTER — Ambulatory Visit (HOSPITAL_COMMUNITY)
Admission: RE | Admit: 2024-06-29 | Discharge: 2024-06-29 | Disposition: A | Discharge status: Home / Self Care | Source: Ambulatory Visit | Attending: Urology | Admitting: Urology

## 2024-06-29 DIAGNOSIS — N200 Calculus of kidney: Secondary | ICD-10-CM

## 2024-07-04 ENCOUNTER — Ambulatory Visit: Admitting: Urology
